# Patient Record
Sex: Male | Born: 1970 | Race: Black or African American | Hispanic: No | Marital: Single | Smoking: Current every day smoker
Health system: Southern US, Community
[De-identification: ages and names within clinical notes are randomized; demographics above are authoritative.]

## PROBLEM LIST (undated history)

## (undated) DIAGNOSIS — J45909 Unspecified asthma, uncomplicated: Secondary | ICD-10-CM

---

## 1998-01-18 ENCOUNTER — Encounter: Payer: Self-pay | Admitting: *Deleted

## 1998-01-18 ENCOUNTER — Emergency Department (HOSPITAL_COMMUNITY): Admission: EM | Admit: 1998-01-18 | Discharge: 1998-01-18 | Payer: Self-pay | Admitting: Emergency Medicine

## 2001-09-08 ENCOUNTER — Emergency Department (HOSPITAL_COMMUNITY): Admission: EM | Admit: 2001-09-08 | Discharge: 2001-09-08 | Payer: Self-pay

## 2004-08-22 ENCOUNTER — Emergency Department (HOSPITAL_COMMUNITY): Admission: EM | Admit: 2004-08-22 | Discharge: 2004-08-22 | Payer: Self-pay | Admitting: Emergency Medicine

## 2004-11-18 ENCOUNTER — Emergency Department (HOSPITAL_COMMUNITY): Admission: EM | Admit: 2004-11-18 | Discharge: 2004-11-18 | Payer: Self-pay | Admitting: Emergency Medicine

## 2005-01-03 ENCOUNTER — Emergency Department (HOSPITAL_COMMUNITY): Admission: EM | Admit: 2005-01-03 | Discharge: 2005-01-03 | Payer: Self-pay | Admitting: Emergency Medicine

## 2005-01-28 ENCOUNTER — Emergency Department (HOSPITAL_COMMUNITY): Admission: EM | Admit: 2005-01-28 | Discharge: 2005-01-28 | Payer: Self-pay | Admitting: Emergency Medicine

## 2005-02-17 ENCOUNTER — Emergency Department (HOSPITAL_COMMUNITY): Admission: EM | Admit: 2005-02-17 | Discharge: 2005-02-17 | Payer: Self-pay | Admitting: Emergency Medicine

## 2005-03-16 ENCOUNTER — Emergency Department (HOSPITAL_COMMUNITY): Admission: EM | Admit: 2005-03-16 | Discharge: 2005-03-16 | Payer: Self-pay | Admitting: Emergency Medicine

## 2006-01-19 ENCOUNTER — Emergency Department (HOSPITAL_COMMUNITY): Admission: EM | Admit: 2006-01-19 | Discharge: 2006-01-19 | Payer: Self-pay | Admitting: Emergency Medicine

## 2006-03-08 ENCOUNTER — Emergency Department (HOSPITAL_COMMUNITY): Admission: EM | Admit: 2006-03-08 | Discharge: 2006-03-08 | Payer: Self-pay | Admitting: Emergency Medicine

## 2006-05-19 ENCOUNTER — Emergency Department (HOSPITAL_COMMUNITY): Admission: EM | Admit: 2006-05-19 | Discharge: 2006-05-19 | Payer: Self-pay | Admitting: Emergency Medicine

## 2006-12-23 ENCOUNTER — Emergency Department (HOSPITAL_COMMUNITY): Admission: EM | Admit: 2006-12-23 | Discharge: 2006-12-23 | Payer: Self-pay | Admitting: Emergency Medicine

## 2007-01-28 ENCOUNTER — Emergency Department (HOSPITAL_COMMUNITY): Admission: EM | Admit: 2007-01-28 | Discharge: 2007-01-28 | Payer: Self-pay | Admitting: Emergency Medicine

## 2007-02-03 ENCOUNTER — Emergency Department (HOSPITAL_COMMUNITY): Admission: EM | Admit: 2007-02-03 | Discharge: 2007-02-03 | Payer: Self-pay | Admitting: Emergency Medicine

## 2007-02-27 ENCOUNTER — Emergency Department (HOSPITAL_COMMUNITY): Admission: EM | Admit: 2007-02-27 | Discharge: 2007-02-27 | Payer: Self-pay | Admitting: Emergency Medicine

## 2007-03-06 ENCOUNTER — Emergency Department (HOSPITAL_COMMUNITY): Admission: EM | Admit: 2007-03-06 | Discharge: 2007-03-06 | Payer: Self-pay | Admitting: *Deleted

## 2007-03-14 ENCOUNTER — Ambulatory Visit: Payer: Self-pay | Admitting: Internal Medicine

## 2007-04-24 ENCOUNTER — Ambulatory Visit: Payer: Self-pay | Admitting: Internal Medicine

## 2007-05-26 ENCOUNTER — Ambulatory Visit: Payer: Self-pay | Admitting: Internal Medicine

## 2007-06-09 ENCOUNTER — Ambulatory Visit: Payer: Self-pay | Admitting: Internal Medicine

## 2007-07-07 ENCOUNTER — Ambulatory Visit: Payer: Self-pay | Admitting: Internal Medicine

## 2007-08-09 ENCOUNTER — Ambulatory Visit: Payer: Self-pay | Admitting: Family Medicine

## 2008-03-21 ENCOUNTER — Ambulatory Visit: Payer: Self-pay | Admitting: Internal Medicine

## 2008-04-29 IMAGING — CR DG CHEST 2V
2 series · 2 of 2 positions shown · non-contrast
Comparison: 08/22/04.

CLINICAL DATA: Wheezing/short of breath.
 CHEST ? 2 VIEW:

[w chest pa]
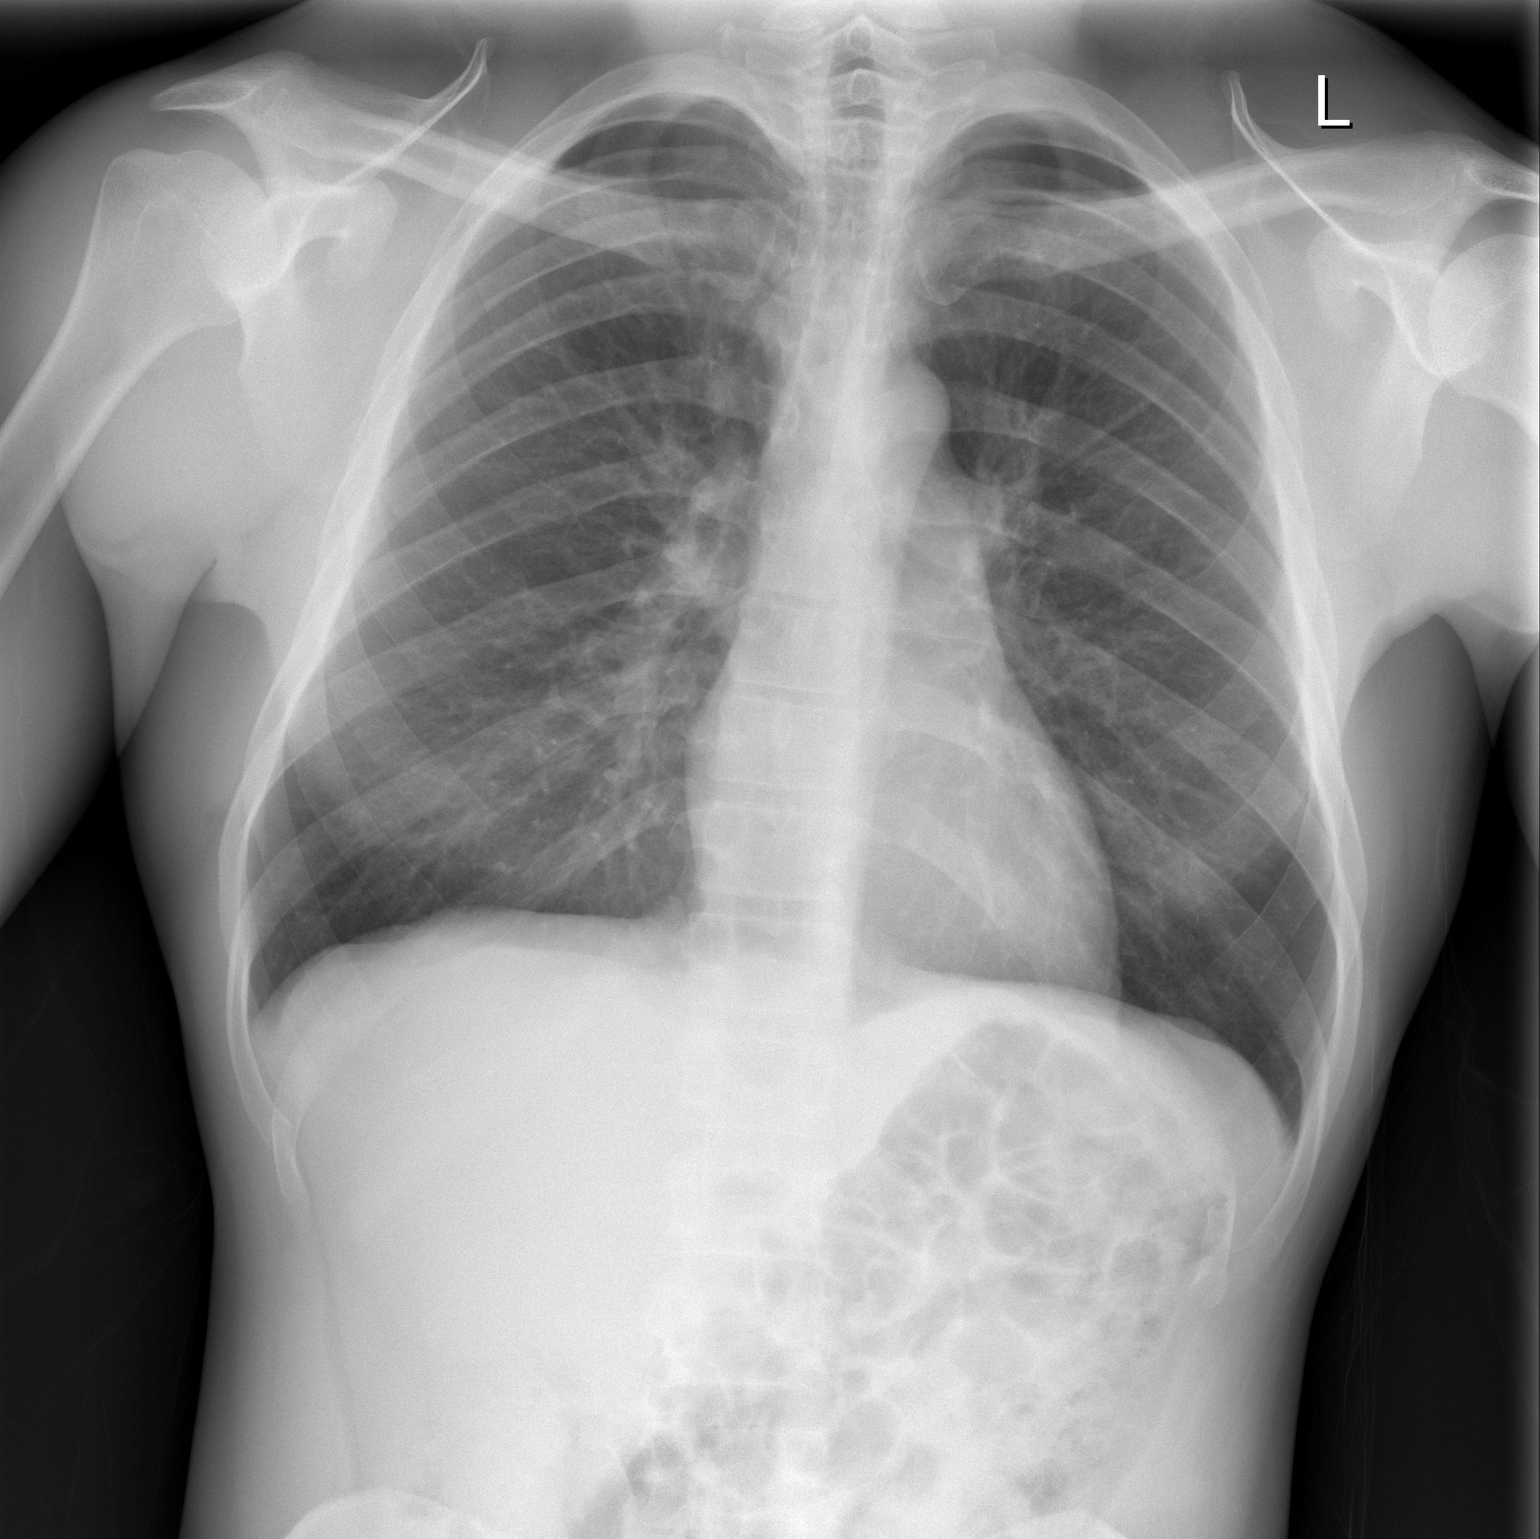

[w chest lat]
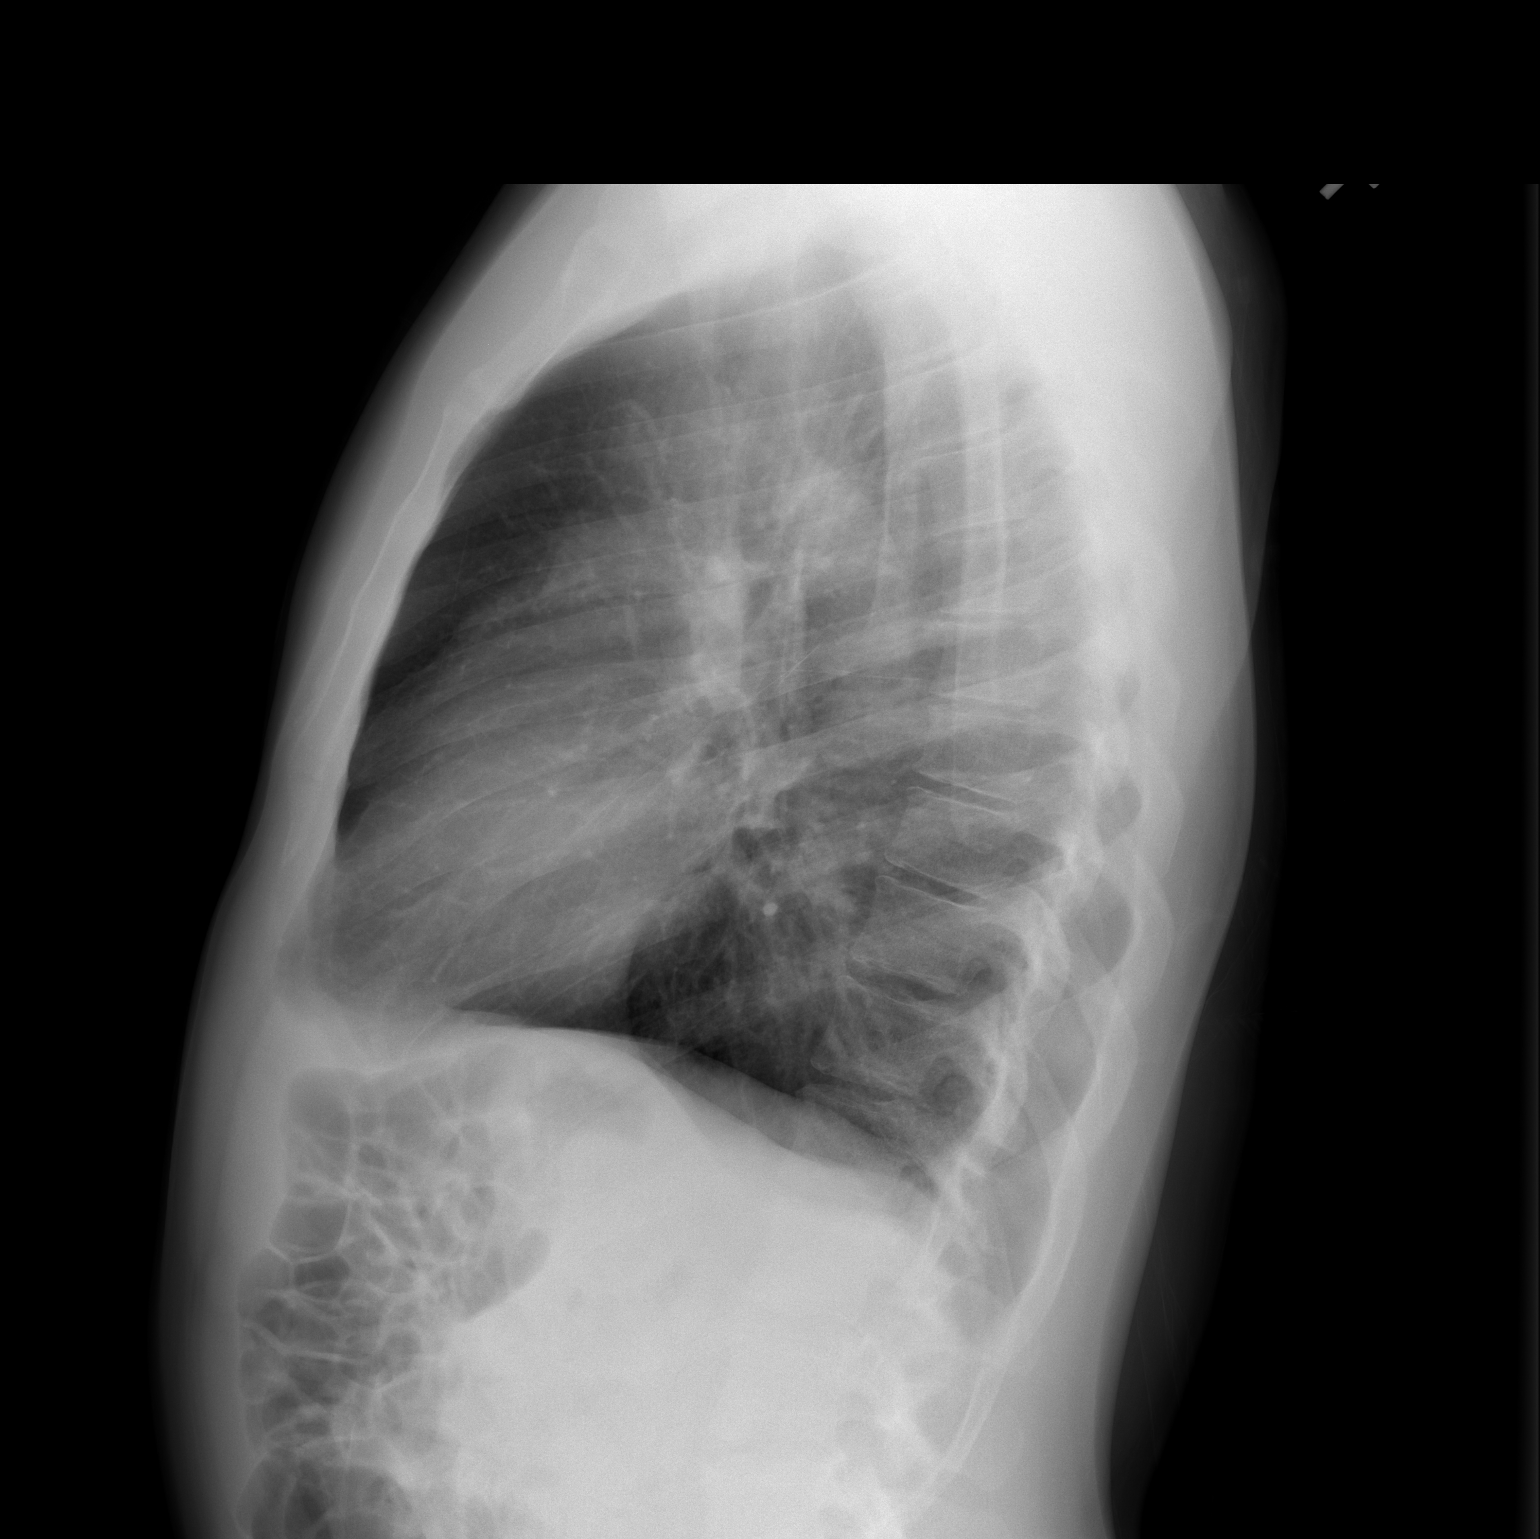

[2 of 2 positions shown; findings below may reference images not displayed]

FINDINGS: Heart and vascularity are normal.  There is moderate hyperaeration consistent with obstructive pulmonary disease.  No active airspace disease or pleural fluid.
IMPRESSION: Hyperaeration/COPD ? no acute process.

## 2009-06-09 ENCOUNTER — Emergency Department (HOSPITAL_COMMUNITY): Admission: EM | Admit: 2009-06-09 | Discharge: 2009-06-09 | Payer: Self-pay | Admitting: Emergency Medicine

## 2009-06-16 ENCOUNTER — Emergency Department (HOSPITAL_COMMUNITY): Admission: EM | Admit: 2009-06-16 | Discharge: 2009-06-16 | Payer: Self-pay | Admitting: Emergency Medicine

## 2009-08-14 ENCOUNTER — Encounter (INDEPENDENT_AMBULATORY_CARE_PROVIDER_SITE_OTHER): Payer: Self-pay | Admitting: Internal Medicine

## 2009-08-14 ENCOUNTER — Ambulatory Visit: Payer: Self-pay | Admitting: Family Medicine

## 2009-08-14 LAB — CONVERTED CEMR LAB
PSA: 1.26 ng/mL (ref 0.10–4.00)
Testosterone: 418.04 ng/dL (ref 350–890)

## 2014-09-15 ENCOUNTER — Emergency Department (HOSPITAL_COMMUNITY)
Admission: EM | Admit: 2014-09-15 | Discharge: 2014-09-15 | Disposition: A | Discharge status: Home / Self Care | Payer: Self-pay | Attending: Emergency Medicine | Admitting: Emergency Medicine

## 2014-09-15 ENCOUNTER — Encounter (HOSPITAL_COMMUNITY): Payer: Self-pay

## 2014-09-15 DIAGNOSIS — R369 Urethral discharge, unspecified: Secondary | ICD-10-CM | POA: Insufficient documentation

## 2014-09-15 DIAGNOSIS — J45909 Unspecified asthma, uncomplicated: Secondary | ICD-10-CM | POA: Insufficient documentation

## 2014-09-15 DIAGNOSIS — Z720 Tobacco use: Secondary | ICD-10-CM | POA: Insufficient documentation

## 2014-09-15 DIAGNOSIS — R300 Dysuria: Secondary | ICD-10-CM | POA: Insufficient documentation

## 2014-09-15 HISTORY — DX: Unspecified asthma, uncomplicated: J45.909

## 2014-09-15 LAB — URINALYSIS, ROUTINE W REFLEX MICROSCOPIC
Bilirubin Urine: NEGATIVE
GLUCOSE, UA: NEGATIVE mg/dL
HGB URINE DIPSTICK: NEGATIVE
Ketones, ur: NEGATIVE mg/dL
Leukocytes, UA: NEGATIVE
Nitrite: NEGATIVE
PH: 7 (ref 5.0–8.0)
Protein, ur: NEGATIVE mg/dL
SPECIFIC GRAVITY, URINE: 1.02 (ref 1.005–1.030)
Urobilinogen, UA: 0.2 mg/dL (ref 0.0–1.0)

## 2014-09-15 MED ORDER — AZITHROMYCIN 250 MG PO TABS
1000.0000 mg | ORAL_TABLET | Freq: Once | ORAL | Status: AC
  Administered 2014-09-15: 1000 mg via ORAL
  Filled 2014-09-15: qty 4

## 2014-09-15 MED ORDER — LIDOCAINE HCL (PF) 1 % IJ SOLN
5.0000 mL | Freq: Once | INTRAMUSCULAR | Status: AC
  Administered 2014-09-15: 5 mL via INTRADERMAL

## 2014-09-15 MED ORDER — ONDANSETRON 4 MG PO TBDP
4.0000 mg | ORAL_TABLET | Freq: Once | ORAL | Status: AC
  Administered 2014-09-15: 4 mg via ORAL
  Filled 2014-09-15: qty 1

## 2014-09-15 MED ORDER — LIDOCAINE HCL (PF) 1 % IJ SOLN
INTRAMUSCULAR | Status: AC
  Administered 2014-09-15: 5 mL via INTRADERMAL
  Filled 2014-09-15: qty 5

## 2014-09-15 MED ORDER — CEFTRIAXONE SODIUM 250 MG IJ SOLR
250.0000 mg | Freq: Once | INTRAMUSCULAR | Status: AC
  Administered 2014-09-15: 250 mg via INTRAMUSCULAR
  Filled 2014-09-15: qty 250

## 2014-09-15 NOTE — ED Notes (Signed)
Pt given water, and informed of need of urine.

## 2014-09-15 NOTE — ED Notes (Signed)
Pt reports 2 days urinary soreness, burning when urinating.  Crust noted at tip of penis at times.  Wants checked for STD.

## 2014-09-15 NOTE — ED Provider Notes (Signed)
CSN: [id number]     Arrival date & time 09/15/14  1155 History  This chart was scribed for non-physician practitioner, Margarita Mail, working with Malvin Johns, MD by Molli Posey, ED Scribe. This patient was seen in room TR07C/TR07C and the patient's care was started at 12:33 PM.   Chief Complaint  Patient presents with  . SEXUALLY TRANSMITTED DISEASE   The history is provided by the patient. No language interpreter was used.   HPI Comments: Jared Houston is a 44 y.o. male with a history of asthma who presents to the Emergency Department complaining of pain with urination for the last 2 days. Pt reports associated burning and penile discharge. Pt states that his penis is slightly sore as well. Pt reports no modifying or exacerbating factors at this time. Pt reports no pertinent past medical history. He denies testicular pain.   Past Medical History  Diagnosis Date  . Asthma    No past surgical history on file. No family history on file. History  Substance Use Topics  . Smoking status: Current Every Day Smoker -- 0.50 packs/day    Types: Cigarettes  . Smokeless tobacco: Not on file  . Alcohol Use: Yes     Comment: 12 beers a week     Review of Systems  Constitutional: Negative for fever.  Genitourinary: Positive for dysuria, discharge and penile pain. Negative for testicular pain.   Allergies  Review of patient's allergies indicates no known allergies.  Home Medications   Prior to Admission medications   Not on File   BP 137/80 mmHg  Pulse 97  Temp(Src) 98.3 F (36.8 C) (Oral)  Resp 16  Ht 5\' 7"  (1.702 m)  Wt 193 lb 9.6 oz (87.816 kg)  BMI 30.31 kg/m2  SpO2 97%   Physical Exam  Constitutional: He is oriented to person, place, and time. He appears well-developed and well-nourished.  HENT:  Head: Normocephalic and atraumatic.  Eyes: Right eye exhibits no discharge. Left eye exhibits no discharge.  Neck: Neck supple. No tracheal deviation present.   Cardiovascular: Normal rate.   Pulmonary/Chest: Effort normal. No respiratory distress.  Abdominal: He exhibits no distension.  Genitourinary:  Circumcised penis. Redness at the entrance of meatus. No inguinal lymphadenopathy. No visible discharge. No testicular pain.   Neurological: He is alert and oriented to person, place, and time.  Skin: Skin is warm and dry.  Psychiatric: He has a normal mood and affect. His behavior is normal.  Nursing note and vitals reviewed.   ED Course  Procedures   DIAGNOSTIC STUDIES: Oxygen Saturation is 97% on RA, normal by my interpretation.    COORDINATION OF CARE: 12:38 PM Discussed treatment plan with pt at bedside and pt agreed to plan.   Labs Review Labs Reviewed  HIV ANTIBODY (ROUTINE TESTING)  RPR  URINALYSIS, ROUTINE W REFLEX MICROSCOPIC  GC/CHLAMYDIA PROBE AMP ([place])    Imaging Review No results found.   EKG Interpretation None      MDM   Final diagnoses:  Dysuria     Pt has been treated prophylacticly with azithromycin and rocephin . hemodynamically stable and  No concern for protstatitis/epididymitis.  I personally performed the services described in this documentation, which was scribed in my presence. The recorded information has been reviewed and is accurate.        Margarita Mail, PA-C 09/16/14 2105  Malvin Johns, MD 09/16/14 [phone]

## 2014-09-15 NOTE — Discharge Instructions (Signed)

## 2014-09-16 LAB — HIV ANTIBODY (ROUTINE TESTING W REFLEX): HIV Screen 4th Generation wRfx: NONREACTIVE

## 2014-09-16 LAB — RPR: RPR Ser Ql: NONREACTIVE

## 2014-10-29 ENCOUNTER — Other Ambulatory Visit (HOSPITAL_COMMUNITY)
Admission: RE | Admit: 2014-10-29 | Discharge: 2014-10-29 | Disposition: A | Discharge status: Home / Self Care | Payer: No Typology Code available for payment source | Source: Ambulatory Visit | Attending: Family Medicine | Admitting: Family Medicine

## 2014-10-29 ENCOUNTER — Encounter (HOSPITAL_COMMUNITY): Payer: Self-pay | Admitting: Emergency Medicine

## 2014-10-29 ENCOUNTER — Emergency Department (INDEPENDENT_AMBULATORY_CARE_PROVIDER_SITE_OTHER)
Admission: EM | Admit: 2014-10-29 | Discharge: 2014-10-29 | Disposition: A | Discharge status: Home / Self Care | Payer: No Typology Code available for payment source | Source: Home / Self Care | Attending: Family Medicine | Admitting: Family Medicine

## 2014-10-29 DIAGNOSIS — J45909 Unspecified asthma, uncomplicated: Secondary | ICD-10-CM

## 2014-10-29 DIAGNOSIS — Z202 Contact with and (suspected) exposure to infections with a predominantly sexual mode of transmission: Secondary | ICD-10-CM

## 2014-10-29 DIAGNOSIS — Z113 Encounter for screening for infections with a predominantly sexual mode of transmission: Secondary | ICD-10-CM | POA: Insufficient documentation

## 2014-10-29 DIAGNOSIS — R300 Dysuria: Secondary | ICD-10-CM

## 2014-10-29 DIAGNOSIS — R369 Urethral discharge, unspecified: Secondary | ICD-10-CM

## 2014-10-29 MED ORDER — LIDOCAINE HCL (PF) 1 % IJ SOLN
INTRAMUSCULAR | Status: AC
  Filled 2014-10-29: qty 5

## 2014-10-29 MED ORDER — DIPHENHYDRAMINE HCL 50 MG/ML IJ SOLN
INTRAMUSCULAR | Status: AC
  Filled 2014-10-29: qty 1

## 2014-10-29 MED ORDER — METHYLPREDNISOLONE ACETATE 80 MG/ML IJ SUSP
INTRAMUSCULAR | Status: AC
  Filled 2014-10-29: qty 1

## 2014-10-29 MED ORDER — CEFTRIAXONE SODIUM 250 MG IJ SOLR
INTRAMUSCULAR | Status: AC
  Filled 2014-10-29: qty 250

## 2014-10-29 MED ORDER — ALBUTEROL SULFATE HFA 108 (90 BASE) MCG/ACT IN AERS: 2.0000 | INHALATION_SPRAY | RESPIRATORY_TRACT | Status: AC | PRN

## 2014-10-29 MED ORDER — AZITHROMYCIN 250 MG PO TABS
1000.0000 mg | ORAL_TABLET | Freq: Once | ORAL | Status: AC
  Administered 2014-10-29: 1000 mg via ORAL

## 2014-10-29 MED ORDER — BECLOMETHASONE DIPROPIONATE 80 MCG/ACT IN AERS
1.0000 | INHALATION_SPRAY | Freq: Two times a day (BID) | RESPIRATORY_TRACT | Status: AC
Start: 2014-10-29 — End: ?

## 2014-10-29 MED ORDER — AZITHROMYCIN 250 MG PO TABS
ORAL_TABLET | ORAL | Status: AC
  Filled 2014-10-29: qty 4

## 2014-10-29 MED ORDER — METHYLPREDNISOLONE ACETATE 80 MG/ML IJ SUSP
80.0000 mg | Freq: Once | INTRAMUSCULAR | Status: AC
  Administered 2014-10-29: 80 mg via INTRAMUSCULAR

## 2014-10-29 MED ORDER — DIPHENHYDRAMINE HCL 50 MG/ML IJ SOLN
50.0000 mg | Freq: Once | INTRAMUSCULAR | Status: AC
  Administered 2014-10-29: 50 mg via INTRAMUSCULAR

## 2014-10-29 MED ORDER — CEFTRIAXONE SODIUM 250 MG IJ SOLR
250.0000 mg | Freq: Once | INTRAMUSCULAR | Status: AC
  Administered 2014-10-29: 250 mg via INTRAMUSCULAR

## 2014-10-29 NOTE — ED Notes (Signed)
Pt states he feels the same and now his stomach hurts a little.  Pt is in no acute distress.  Janne Napoleon, NP aware.  Pt ready for d/c.

## 2014-10-29 NOTE — ED Notes (Signed)
Pt complains of itching all over and his lips swelling slightly.  Janne Napoleon, NP notified and is at the bedside.

## 2014-10-29 NOTE — Discharge Instructions (Signed)
Dysuria Dysuria is the medical term for pain with urination. There are many causes for dysuria, but urinary tract infection is the most common. If a urinalysis was performed it can show that there is a urinary tract infection. A urine culture confirms that you or your child is sick. You will need to follow up with a healthcare provider because:  If a urine culture was done you will need to know the culture results and treatment recommendations.  If the urine culture was positive, you or your child will need to be put on antibiotics or know if the antibiotics prescribed are the right antibiotics for your urinary tract infection.  If the urine culture is negative (no urinary tract infection), then other causes may need to be explored or antibiotics need to be stopped. Today laboratory work may have been done and there does not seem to be an infection. If cultures were done they will take at least 24 to 48 hours to be completed. Today x-rays may have been taken and they read as normal. No cause can be found for the problems. The x-rays may be re-read by a radiologist and you will be contacted if additional findings are made. You or your child may have been put on medications to help with this problem until you can see your primary caregiver. If the problems get better, see your primary caregiver if the problems return. If you were given antibiotics (medications which kill germs), take all of the mediations as directed for the full course of treatment.  If laboratory work was done, you need to find the results. Leave a telephone number where you can be reached. If this is not possible, make sure you find out how you are to get test results. HOME CARE INSTRUCTIONS   Drink lots of fluids. For adults, drink eight, 8 ounce glasses of clear juice or water a day. For children, replace fluids as suggested by your caregiver.  Empty the bladder often. Avoid holding urine for long periods of time.  After a bowel  movement, women should cleanse front to back, using each tissue only once.  Empty your bladder before and after sexual intercourse.  Take all the medicine given to you until it is gone. You may feel better in a few days, but TAKE ALL MEDICINE.  Avoid caffeine, tea, alcohol and carbonated beverages, because they tend to irritate the bladder.  In men, alcohol may irritate the prostate.  Only take over-the-counter or prescription medicines for pain, discomfort, or fever as directed by your caregiver.  If your caregiver has given you a follow-up appointment, it is very important to keep that appointment. Not keeping the appointment could result in a chronic or permanent injury, pain, and disability. If there is any problem keeping the appointment, you must call back to this facility for assistance. SEEK IMMEDIATE MEDICAL CARE IF:   Back pain develops.  A fever develops.  There is nausea (feeling sick to your stomach) or vomiting (throwing up).  Problems are no better with medications or are getting worse. MAKE SURE YOU:   Understand these instructions.  Will watch your condition.  Will get help right away if you are not doing well or get worse. Document Released: 01/30/2004 Document Revised: 07/26/2011 Document Reviewed: 12/07/2007 [facility] Patient Information 2015 [place], [place]. This information is not intended to replace advice given to you by your health care provider. Make sure you discuss any questions you have with your health care provider.  Safe Sex Safe  sex is about reducing the risk of giving or getting a sexually transmitted disease (STD). STDs are spread through sexual contact involving the genitals, mouth, or rectum. Some STDs can be cured and others cannot. Safe sex can also prevent unintended pregnancies.  WHAT ARE SOME SAFE SEX PRACTICES?  Limit your sexual activity to only one partner who is having sex with only you.  Talk to your partner about his or her past  partners, past STDs, and drug use.  Use a condom every time you have sexual intercourse. This includes vaginal, oral, and anal sexual activity. Both females and males should wear condoms during oral sex. Only use latex or polyurethane condoms and water-based lubricants. Using petroleum-based lubricants or oils to lubricate a condom will weaken the condom and increase the chance that it will break. The condom should be in place from the beginning to the end of sexual activity. Wearing a condom reduces, but does not completely eliminate, your risk of getting or giving an STD. STDs can be spread by contact with infected body fluids and skin.  Get vaccinated for hepatitis B and HPV.  Avoid alcohol and recreational drugs, which can affect your judgment. You may forget to use a condom or participate in high-risk sex.  For females, avoid douching after sexual intercourse. Douching can spread an infection farther into the reproductive tract.  Check your body for signs of sores, blisters, rashes, or unusual discharge. See your health care provider if you notice any of these signs.  Avoid sexual contact if you have symptoms of an infection or are being treated for an STD. If you or your partner has herpes, avoid sexual contact when blisters are present. Use condoms at all other times.  If you are at risk of being infected with HIV, it is recommended that you take a prescription medicine daily to prevent HIV infection. This is called pre-exposure prophylaxis (PrEP). You are considered at risk if:  You are a man who has sex with other men (MSM).  You are a heterosexual man or woman who is sexually active with more than one partner.  You take drugs by injection.  You are sexually active with a partner who has HIV.  Talk with your health care provider about whether you are at high risk of being infected with HIV. If you choose to begin PrEP, you should first be tested for HIV. You should then be tested  every 3 months for as long as you are taking PrEP.  See your health care provider for regular screenings, exams, and tests for other STDs. Before having sex with a new partner, each of you should be screened for STDs and should talk about the results with each other. WHAT ARE THE BENEFITS OF SAFE SEX?   There is less chance of getting or giving an STD.  You can prevent unwanted or unintended pregnancies.  By discussing safe sex concerns with your partner, you may increase feelings of intimacy, comfort, trust, and honesty between the two of you. Document Released: 06/10/2004 Document Revised: 09/17/2013 Document Reviewed: 10/25/2011 [facility] Patient Information 2015 [place], [place]. This information is not intended to replace advice given to you by your health care provider. Make sure you discuss any questions you have with your health care provider.  Sexually Transmitted Disease A sexually transmitted disease (STD) is a disease or infection that may be passed (transmitted) from person to person, usually during sexual activity. This may happen by way of saliva, semen, blood, vaginal mucus,  or urine. Common STDs include:   Gonorrhea.   Chlamydia.   Syphilis.   HIV and AIDS.   Genital herpes.   Hepatitis B and C.   Trichomonas.   Human papillomavirus (HPV).   Pubic lice.   Scabies.  Mites.  Bacterial vaginosis. WHAT ARE CAUSES OF STDs? An STD may be caused by bacteria, a virus, or parasites. STDs are often transmitted during sexual activity if one person is infected. However, they may also be transmitted through nonsexual means. STDs may be transmitted after:   Sexual intercourse with an infected person.   Sharing sex toys with an infected person.   Sharing needles with an infected person or using unclean piercing or tattoo needles.  Having intimate contact with the genitals, mouth, or rectal areas of an infected person.   Exposure to infected fluids during  birth. WHAT ARE THE SIGNS AND SYMPTOMS OF STDs? Different STDs have different symptoms. Some people may not have any symptoms. If symptoms are present, they may include:   Painful or bloody urination.   Pain in the pelvis, abdomen, vagina, anus, throat, or eyes.   A skin rash, itching, or irritation.  Growths, ulcerations, blisters, or sores in the genital and anal areas.  Abnormal vaginal discharge with or without bad odor.   Penile discharge in men.   Fever.   Pain or bleeding during sexual intercourse.   Swollen glands in the groin area.   Yellow skin and eyes (jaundice). This is seen with hepatitis.   Swollen testicles.  Infertility.  Sores and blisters in the mouth. HOW ARE STDs DIAGNOSED? To make a diagnosis, your health care provider may:   Take a medical history.   Perform a physical exam.   Take a sample of any discharge to examine.  Swab the throat, cervix, opening to the penis, rectum, or vagina for testing.  Test a sample of your first morning urine.   Perform blood tests.   Perform a Pap test, if this applies.   Perform a colposcopy.   Perform a laparoscopy.  HOW ARE STDs TREATED? Treatment depends on the STD. Some STDs may be treated but not cured.   Chlamydia, gonorrhea, trichomonas, and syphilis can be cured with antibiotic medicine.   Genital herpes, hepatitis, and HIV can be treated, but not cured, with prescribed medicines. The medicines lessen symptoms.   Genital warts from HPV can be treated with medicine or by freezing, burning (electrocautery), or surgery. Warts may come back.   HPV cannot be cured with medicine or surgery. However, abnormal areas may be removed from the cervix, vagina, or vulva.   If your diagnosis is confirmed, your recent sexual partners need treatment. This is true even if they are symptom-free or have a negative culture or evaluation. They should not have sex until their health care providers  say it is okay. HOW CAN I REDUCE MY RISK OF GETTING AN STD? Take these steps to reduce your risk of getting an STD:  Use latex condoms, dental dams, and water-soluble lubricants during sexual activity. Do not use petroleum jelly or oils.  Avoid having multiple sex partners.  Do not have sex with someone who has other sex partners.  Do not have sex with anyone you do not know or who is at high risk for an STD.  Avoid risky sex practices that can break your skin.  Do not have sex if you have open sores on your mouth or skin.  Avoid drinking too much alcohol  or taking illegal drugs. Alcohol and drugs can affect your judgment and put you in a vulnerable position.  Avoid engaging in oral and anal sex acts.  Get vaccinated for HPV and hepatitis. If you have not received these vaccines in the past, talk to your health care provider about whether one or both might be right for you.   If you are at risk of being infected with HIV, it is recommended that you take a prescription medicine daily to prevent HIV infection. This is called pre-exposure prophylaxis (PrEP). You are considered at risk if:  You are a man who has sex with other men (MSM).  You are a heterosexual man or woman and are sexually active with more than one partner.  You take drugs by injection.  You are sexually active with a partner who has HIV.  Talk with your health care provider about whether you are at high risk of being infected with HIV. If you choose to begin PrEP, you should first be tested for HIV. You should then be tested every 3 months for as long as you are taking PrEP.  WHAT SHOULD I DO IF I THINK I HAVE AN STD?  See your health care provider.   Tell your sexual partner(s). They should be tested and treated for any STDs.  Do not have sex until your health care provider says it is okay. WHEN SHOULD I GET IMMEDIATE MEDICAL CARE? Contact your health care provider right away if:   You have severe  abdominal pain.  You are a man and notice swelling or pain in your testicles.  You are a woman and notice swelling or pain in your vagina. Document Released: 07/24/2002 Document Revised: 05/08/2013 Document Reviewed: 11/21/2012 [facility] Patient Information 2015 [place], [place]. This information is not intended to replace advice given to you by your health care provider. Make sure you discuss any questions you have with your health care provider.

## 2014-10-29 NOTE — ED Notes (Signed)
Pt states he was treated for an STD a month ago with a shot and some pills.  He was never called with any positive results.  He is having some of the same symptoms again.  He is also suffering from asthma because he has run out [facility] and [facility].  He needs a medication refill on Proventil and QVar.

## 2014-10-29 NOTE — ED Provider Notes (Signed)
CSN: [id number]     Arrival date & time 10/29/14  1429 History   First MD Initiated Contact with Patient 10/29/14 1622     Chief Complaint  Patient presents with  . Exposure to STD  . Asthma   (Consider location/radiation/quality/duration/timing/severity/associated sxs/prior Treatment) HPI Comments: 44 year old male complaining of penile discharge and swelling of the urethra. States symptoms began 2 days ago. The symptoms are identical to the symptoms for which she was treated a month ago with an injection and pills. He is also requesting a refill on his albuterol medication since they did not give him enough medicine when [facility] the [facility] 3 months ago. [facility] an appointment with [facility] in a little over a month.   Past Medical History  Diagnosis Date  . Asthma    History reviewed. No pertinent past surgical history. History reviewed. No pertinent family history. History  Substance Use Topics  . Smoking status: Current Every Day Smoker -- 0.50 packs/day    Types: Cigarettes  . Smokeless tobacco: Not on file  . Alcohol Use: Yes     Comment: 12 beers a week     Review of Systems  Constitutional: Negative.   Respiratory: Positive for shortness of breath and wheezing.   Genitourinary: Positive for dysuria and discharge. Negative for urgency, hematuria, penile swelling, scrotal swelling, genital sores and testicular pain.  All other systems reviewed and are negative.   Allergies  Review of patient's allergies indicates no known allergies.  Home Medications   Prior to Admission medications   Medication Sig Start Date End Date Taking? Authorizing Provider  albuterol (PROVENTIL HFA) 108 (90 BASE) MCG/ACT inhaler Inhale 2 puffs into the lungs every 6 (six) hours as needed for wheezing or shortness of breath.   Yes Historical Provider, MD  beclomethasone (QVAR) 80 MCG/ACT inhaler Inhale 1 puff into the lungs 2 (two) times daily.   Yes Historical Provider, MD    BP 134/89 mmHg  Pulse 56  Temp(Src) 98.7 F (37.1 C) (Oral)  Resp 12  SpO2 100% Physical Exam  Constitutional: He is oriented to person, place, and time. He appears well-developed and well-nourished.  Neck: Normal range of motion. Neck supple.  Pulmonary/Chest: Effort normal. No respiratory distress.  Genitourinary: Penis normal. No penile tenderness.  Normal external male genitalia No external lesions observed. Bilateral  testicles descended. No enlargement or tenderness. no palpable nodules. No swelling of the scrotum or testicles. No tenderness to the epididymal apparatus. No surrounding lymphadenopathy.   Musculoskeletal: He exhibits no edema.  Neurological: He is alert and oriented to person, place, and time.  Skin: Skin is warm and dry.  Nursing note and vitals reviewed.   ED Course  Procedures (including critical care time) Labs Review Labs Reviewed  URINE CYTOLOGY ANCILLARY ONLY    Imaging Review No results found.   MDM   1. Abnormal penile discharge   2. Dysuria   3. Exposure to STD    Urine cytology pending Rocephin 250 mg IM Azithromycin 1 gm po     Janne Napoleon, NP 10/29/14 1641  Within 10 minutes of receiving the azithromycin and ceftriaxone injection the patient started itching all over and feeling like his upper lip was swelling. He states that this happened last month he received these medications but did not say anything about it today. He is given Benadryl 50 mg IM and Depo-Medrol 80 mg IM. Reexamination reveals clear lungs, no wheezing. No intraoral swelling. No observed swelling of  the face or lips. And I do not see evidence of urticaria at this time. Pt discharged asymptomatic approx 1725.  Janne Napoleon, NP 10/29/14 1739

## 2014-10-30 LAB — URINE CYTOLOGY ANCILLARY ONLY
CHLAMYDIA, DNA PROBE: POSITIVE — AB
Neisseria Gonorrhea: NEGATIVE
TRICH (WINDOWPATH): NEGATIVE

## 2014-10-31 NOTE — ED Notes (Signed)
Final report of STD screening positive for chlamydia, treatment adequate on day of clinic visit. Form 2124 [employer]HS completed and faxed to [facility] for their records. Called and left message for patient to contact us ASAP

## 2014-11-02 NOTE — ED Notes (Signed)
Patient returned call. After verifying ID, discussed positive findings. Was advised to have any partners to be informed , and will have them go for treatment. Advised to practice safer sex

## 2018-12-25 ENCOUNTER — Encounter (HOSPITAL_COMMUNITY): Payer: Self-pay

## 2018-12-25 ENCOUNTER — Other Ambulatory Visit: Payer: Self-pay

## 2018-12-25 ENCOUNTER — Emergency Department (HOSPITAL_COMMUNITY)
Admission: EM | Admit: 2018-12-25 | Discharge: 2018-12-25 | Disposition: A | Discharge status: Home / Self Care | Payer: Self-pay | Attending: Emergency Medicine | Admitting: Emergency Medicine

## 2018-12-25 DIAGNOSIS — R300 Dysuria: Secondary | ICD-10-CM

## 2018-12-25 DIAGNOSIS — J45909 Unspecified asthma, uncomplicated: Secondary | ICD-10-CM | POA: Insufficient documentation

## 2018-12-25 DIAGNOSIS — F17210 Nicotine dependence, cigarettes, uncomplicated: Secondary | ICD-10-CM | POA: Insufficient documentation

## 2018-12-25 MED ORDER — GENTAMICIN SULFATE 40 MG/ML IJ SOLN
240.0000 mg | Freq: Once | INTRAMUSCULAR | Status: AC
  Administered 2018-12-25: 19:00:00 240 mg via INTRAMUSCULAR
  Filled 2018-12-25: qty 6

## 2018-12-25 MED ORDER — AZITHROMYCIN 1 G PO PACK
2.0000 g | PACK | Freq: Once | ORAL | Status: AC
  Administered 2018-12-25: 18:00:00 2 g via ORAL
  Filled 2018-12-25: qty 2

## 2018-12-25 NOTE — ED Triage Notes (Signed)
Pt arrives POV for eval of penile burning/dc since Friday. Reports partner is also here w/ similar sx

## 2018-12-25 NOTE — ED Provider Notes (Signed)
[facility] EMERGENCY DEPARTMENT Provider Note   CSN: [id number] Arrival date & time: 12/25/18  1516    History   Chief Complaint Chief Complaint  Patient presents with  . Dysuria    HPI Jared Houston is a 48 y.o. male.     48yo male presents with complaint of urethral discharge x 2-3 days, described as green, associated with dysuria. Denies testicular pain or swelling, any rashes or lesions. Patient is here with his significant other to be tested for STIs. No other complaints or concerns.      Past Medical History:  Diagnosis Date  . Asthma     There are no active problems to display for this patient.   History reviewed. No pertinent surgical history.      Home Medications    Prior to Admission medications   Medication Sig Start Date End Date Taking? Authorizing Provider  albuterol (PROVENTIL HFA;VENTOLIN HFA) 108 (90 BASE) MCG/ACT inhaler Inhale 2 puffs into the lungs every 4 (four) hours as needed for wheezing or shortness of breath. 10/29/14  Yes Mabe, Shanon Brow, NP  Naphazoline HCl (CLEAR EYES OP) Place 1 application into both eyes daily.   Yes [provider]  naproxen sodium (ALEVE) 220 MG tablet Take 440 mg by mouth 2 (two) times daily as needed (pain).   Yes [provider]  beclomethasone (QVAR) 80 MCG/ACT inhaler Inhale 1 puff into the lungs 2 (two) times daily. Patient not taking: Reported on 12/25/2018 10/29/14   Janne Napoleon, NP    Family History History reviewed. No pertinent family history.  Social History Social History   Tobacco Use  . Smoking status: Current Every Day Smoker    Packs/day: 0.50    Types: Cigarettes  Substance Use Topics  . Alcohol use: Yes    Comment: 12 beers a week   . Drug use: No     Allergies   Rocephin [ceftriaxone]   Review of Systems Review of Systems  Constitutional: Negative for fever.  Gastrointestinal: Negative for abdominal pain, constipation, diarrhea, nausea and  vomiting.  Genitourinary: Positive for discharge and dysuria. Negative for penile pain, penile swelling, scrotal swelling and testicular pain.  Skin: Negative for rash and wound.  Allergic/Immunologic: Negative for immunocompromised state.  Neurological: Negative for weakness.  Hematological: Negative for adenopathy.  All other systems reviewed and are negative.    Physical Exam Updated Vital Signs BP 130/83 (BP Location: Right Arm)   Pulse 80   Temp 98.4 F (36.9 C) (Oral)   Resp 20   Ht 5\' 7"  (1.702 m)   Wt 79.4 kg   SpO2 98%   BMI 27.41 kg/m   Physical Exam Vitals signs and nursing note reviewed. Exam conducted with a chaperone present.  Constitutional:      General: He is not in acute distress.    Appearance: Normal appearance. He is well-developed. He is not diaphoretic.  HENT:     Head: Normocephalic and atraumatic.  Pulmonary:     Effort: Pulmonary effort is normal.  Genitourinary:    Penis: Circumcised. Discharge present.      Scrotum/Testes: Normal.     Comments: White discharge present Skin:    General: Skin is warm and dry.     Findings: No erythema or rash.  Neurological:     Mental Status: He is alert and oriented to person, place, and time.  Psychiatric:        Behavior: Behavior normal.      ED  Treatments / Results  Labs (all labs ordered are listed, but only abnormal results are displayed) Labs Reviewed  GC/CHLAMYDIA PROBE AMP ([place]) NOT AT [facility]    EKG None  Radiology No results found.  Procedures Procedures (including critical care time)  Medications Ordered in ED Medications  gentamicin (GARAMYCIN) injection 240 mg (has no administration in time range)  azithromycin (ZITHROMAX) powder 2 g (2 g Oral Given 12/25/18 1826)     Initial Impression / Assessment and Plan / ED Course  I have reviewed the triage vital signs and the nursing notes.  Pertinent labs & imaging results that were available during my care of the patient  were reviewed by me and considered in my medical decision making (see chart for details).  Clinical Course as of Dec 24 1825  Mon Dec 25, 6658  953 48 year old male presents for STD test, states he has had burning with urination as well as green penile discharge.  Patient states his partner is here with similar symptoms.  Patient denies testicular pain or swelling.  On exam has white discharge present.  Gonorrhea chlamydia swab obtained.  Patient has allergy to Rocephin, given previously for STD resulting in severe hives. Patient was given Zithromax and Gentamicin IM per UTD. Advised to abstain from intercourse x 10 days.    [LM]    Clinical Course User Index [LM] Tacy Learn, PA-C      Final Clinical Impressions(s) / ED Diagnoses   Final diagnoses:  Dysuria    ED Discharge Orders    None       Roque Lias 12/25/18 1827    Dorie Rank, MD 12/27/18 [phone]

## 2018-12-25 NOTE — Discharge Instructions (Addendum)
Abstain from intercourse until you know your test results. Call the hospital in 3-5 days for your results. IF your gonorrhea and chlamydia tests are negative, you may resume intercourse. IF either test is positive, your partner will need to go to the health department for free treatment. IF your test is positive, both partners need to abstain from intercourse for 10 days after treatment (this includes all forms of intercourse).

## 2018-12-25 NOTE — ED Notes (Signed)
Patient verbalizes understanding of discharge instructions. Opportunity for questioning and answers were provided. Armband removed by staff, pt discharged from ED.  

## 2018-12-27 LAB — GC/CHLAMYDIA PROBE AMP (~~LOC~~) NOT AT ARMC
Chlamydia: POSITIVE — AB
Neisseria Gonorrhea: POSITIVE — AB

## 2020-01-28 DIAGNOSIS — D123 Benign neoplasm of transverse colon: Secondary | ICD-10-CM

## 2020-01-28 DIAGNOSIS — D120 Benign neoplasm of cecum: Secondary | ICD-10-CM

## 2021-01-29 ENCOUNTER — Ambulatory Visit
Admission: EM | Admit: 2021-01-29 | Discharge: 2021-01-29 | Disposition: A | Discharge status: Home / Self Care | Payer: Self-pay | Attending: Internal Medicine | Admitting: Internal Medicine

## 2021-01-29 ENCOUNTER — Other Ambulatory Visit: Payer: Self-pay

## 2021-01-29 DIAGNOSIS — K047 Periapical abscess without sinus: Secondary | ICD-10-CM

## 2021-01-29 MED ORDER — AMOXICILLIN 875 MG PO TABS: 875.0000 mg | ORAL_TABLET | Freq: Two times a day (BID) | ORAL | 0 refills | Status: AC

## 2021-01-29 NOTE — ED Triage Notes (Signed)
Pt c/o oral abscess and a "bad tooth that needs to be pulled." Abscess is on right side with observable edema to the right cheek onset two days ago. Described as 6/10 achy throbbing pain. States tried 2 aleve today without appreciable relief.

## 2021-01-29 NOTE — ED Provider Notes (Signed)
[facility]    CSN: [id number] Arrival date & time: 01/29/21  1111      History   Chief Complaint Chief Complaint  Patient presents with   Dental Pain    HPI Jared Houston is a 50 y.o. male.   Patient presents with right lower dental pain that has been present for a few days.  Patient is trying to get in with dentist currently for further evaluation and management.  Denies any drainage from the gums or teeth.  Denies any known fevers.  Has been taking Aleve with some relief of pain.    Dental Pain  Past Medical History:  Diagnosis Date   Asthma     There are no problems to display for this patient.   History reviewed. No pertinent surgical history.     Home Medications    Prior to Admission medications   Medication Sig Start Date End Date Taking? Authorizing Provider  amoxicillin (AMOXIL) 875 MG tablet Take 1 tablet (875 mg total) by mouth 2 (two) times daily for 10 days. 01/29/21 02/08/21 Yes Odis Luster, FNP  albuterol (PROVENTIL HFA;VENTOLIN HFA) 108 (90 BASE) MCG/ACT inhaler Inhale 2 puffs into the lungs every 4 (four) hours as needed for wheezing or shortness of breath. 10/29/14   Janne Napoleon, NP  beclomethasone (QVAR) 80 MCG/ACT inhaler Inhale 1 puff into the lungs 2 (two) times daily. Patient not taking: Reported on 12/25/2018 10/29/14   Janne Napoleon, NP  Naphazoline HCl (CLEAR EYES OP) Place 1 application into both eyes daily.    [provider]  naproxen sodium (ALEVE) 220 MG tablet Take 440 mg by mouth 2 (two) times daily as needed (pain).    [provider]    Family History History reviewed. No pertinent family history.  Social History Social History   Tobacco Use   Smoking status: Every Day    Packs/day: 0.50    Types: Cigarettes   Smokeless tobacco: Never  Substance Use Topics   Alcohol use: Yes    Comment: 12 beers a week    Drug use: No     Allergies   Rocephin [ceftriaxone]   Review of  Systems Review of Systems Per HPI  Physical Exam Triage Vital Signs ED Triage Vitals  Enc Vitals Group     BP 01/29/21 1335 (!) 145/75     Pulse Rate 01/29/21 1335 63     Resp 01/29/21 1335 18     Temp 01/29/21 1335 98.3 F (36.8 C)     Temp Source 01/29/21 1335 Oral     SpO2 01/29/21 1335 98 %     Weight --      Height --      Head Circumference --      Peak Flow --      Pain Score 01/29/21 1337 6     Pain Loc --      Pain Edu? --      Excl. in [place]? --    No data found.  Updated Vital Signs BP (!) 145/75 (BP Location: Left Arm)   Pulse 63   Temp 98.3 F (36.8 C) (Oral)   Resp 18   SpO2 98%   Visual Acuity Right Eye Distance:   Left Eye Distance:   Bilateral Distance:    Right Eye Near:   Left Eye Near:    Bilateral Near:     Physical Exam Constitutional:      Appearance: Normal appearance.  HENT:  Head: Normocephalic and atraumatic.     Mouth/Throat:     Mouth: Mucous membranes are moist.     Dentition: Dental tenderness, gingival swelling and dental abscesses present.      Comments: Dental abscess and broken tooth present to right upper gingiva and dentition.  No drainage noted at this time. Eyes:     Extraocular Movements: Extraocular movements intact.     Conjunctiva/sclera: Conjunctivae normal.  Pulmonary:     Effort: Pulmonary effort is normal.  Lymphadenopathy:     Cervical: No cervical adenopathy.  Neurological:     General: No focal deficit present.     Mental Status: He is alert and oriented to person, place, and time. Mental status is at baseline.  Psychiatric:        Mood and Affect: Mood normal.        Behavior: Behavior normal.        Thought Content: Thought content normal.        Judgment: Judgment normal.     UC Treatments / Results  Labs (all labs ordered are listed, but only abnormal results are displayed) Labs Reviewed - No data to display  EKG   Radiology No results found.  Procedures Procedures (including  critical care time)  Medications Ordered in UC Medications - No data to display  Initial Impression / Assessment and Plan / UC Course  I have reviewed the triage vital signs and the nursing notes.  Pertinent labs & imaging results that were available during my care of the patient were reviewed by me and considered in my medical decision making (see chart for details).     Will treat dental abscess with amoxicillin x10 days.  Patient states that he has taken amoxicillin without allergic reaction before.  Patient advised to follow-up with a dentist for further evaluation and management.  Patient to use warm compresses to affected area of face as well.  May continue Aleve for pain. Discussed strict return precautions. Patient verbalized understanding and is agreeable with plan..  Final Clinical Impressions(s) / UC Diagnoses   Final diagnoses:  Dental abscess     Discharge Instructions      Please follow-up with dentist for further evaluation and management.     ED Prescriptions     Medication Sig Dispense Auth. Provider   amoxicillin (AMOXIL) 875 MG tablet Take 1 tablet (875 mg total) by mouth 2 (two) times daily for 10 days. 20 tablet Odis Luster, FNP      PDMP not reviewed this encounter.   Odis Luster, FNP 01/29/21 1424

## 2021-01-29 NOTE — Discharge Instructions (Addendum)
Please follow-up with dentist for further evaluation and management.

## 2021-09-01 ENCOUNTER — Ambulatory Visit
Admission: RE | Admit: 2021-09-01 | Discharge: 2021-09-01 | Disposition: A | Discharge status: Home / Self Care | Payer: Self-pay | Source: Ambulatory Visit

## 2021-09-01 ENCOUNTER — Ambulatory Visit: Payer: Self-pay

## 2021-09-01 VITALS — BP 156/85 | HR 81 | Temp 97.7°F | Resp 16

## 2021-09-01 DIAGNOSIS — T25222A Burn of second degree of left foot, initial encounter: Secondary | ICD-10-CM
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
## 2021-09-01 NOTE — Discharge Instructions (Signed)
Please go to the emergency department as soon as you leave urgent care for further evaluation and management. ?

## 2021-09-01 NOTE — ED Notes (Signed)
Patient is being discharged from the Urgent Care and sent to the Emergency Department via private vehicle . Per [place] patient is in need of higher level of care due to further evaluation. Patient is aware and verbalizes understanding of plan of care.  ?Vitals:  ? 09/01/21 1802  ?BP: (!) 156/85  ?Pulse: 81  ?Resp: 16  ?Temp: 97.7 ?F (36.5 ?C)  ?SpO2: 97%  ?  ?
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
## 2021-09-30 ENCOUNTER — Ambulatory Visit (HOSPITAL_COMMUNITY)
Admission: EM | Admit: 2021-09-30 | Discharge: 2021-09-30 | Disposition: A | Discharge status: Home / Self Care | Payer: Self-pay | Attending: Emergency Medicine | Admitting: Emergency Medicine

## 2021-09-30 ENCOUNTER — Encounter (HOSPITAL_COMMUNITY): Payer: Self-pay

## 2021-09-30 DIAGNOSIS — T3110 Burns involving 10-19% of body surface with 0% to 9% third degree burns: Secondary | ICD-10-CM

## 2021-09-30 MED ORDER — OXYCODONE-ACETAMINOPHEN 5-325 MG PO TABS: 1.0000 | ORAL_TABLET | Freq: Four times a day (QID) | ORAL | 0 refills | Status: DC | PRN

## 2021-09-30 MED ORDER — SILVER SULFADIAZINE 1 % EX CREA: 1.0000 "application " | TOPICAL_CREAM | Freq: Two times a day (BID) | CUTANEOUS | 0 refills | Status: AC

## 2021-09-30 MED ORDER — AMOXICILLIN-POT CLAVULANATE 875-125 MG PO TABS: 1.0000 | ORAL_TABLET | Freq: Two times a day (BID) | ORAL | 0 refills | Status: AC

## 2021-09-30 NOTE — Discharge Instructions (Addendum)
Change your bandage twice a day. Use the antibiotic cream on the wound each time you change it. Please follow up with the wound clinic as soon as possible.  ?
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
## 2021-10-09 ENCOUNTER — Encounter (HOSPITAL_BASED_OUTPATIENT_CLINIC_OR_DEPARTMENT_OTHER): Payer: Self-pay | Attending: General Surgery | Admitting: General Surgery

## 2021-10-09 ENCOUNTER — Ambulatory Visit (HOSPITAL_COMMUNITY)
Admission: EM | Admit: 2021-10-09 | Discharge: 2021-10-09 | Disposition: A | Discharge status: Home / Self Care | Payer: Self-pay | Attending: Emergency Medicine | Admitting: Emergency Medicine

## 2021-10-09 ENCOUNTER — Encounter (HOSPITAL_COMMUNITY): Payer: Self-pay

## 2021-10-09 DIAGNOSIS — T310 Burns involving less than 10% of body surface: Secondary | ICD-10-CM | POA: Insufficient documentation

## 2021-10-09 DIAGNOSIS — X58XXXA Exposure to other specified factors, initial encounter: Secondary | ICD-10-CM | POA: Insufficient documentation

## 2021-10-09 DIAGNOSIS — J45909 Unspecified asthma, uncomplicated: Secondary | ICD-10-CM | POA: Insufficient documentation

## 2021-10-09 DIAGNOSIS — T25222A Burn of second degree of left foot, initial encounter: Secondary | ICD-10-CM | POA: Insufficient documentation

## 2021-10-09 DIAGNOSIS — F17210 Nicotine dependence, cigarettes, uncomplicated: Secondary | ICD-10-CM | POA: Insufficient documentation

## 2021-10-09 DIAGNOSIS — T25222D Burn of second degree of left foot, subsequent encounter: Secondary | ICD-10-CM

## 2021-10-09 MED ORDER — OXYCODONE-ACETAMINOPHEN 5-325 MG PO TABS: 1.0000 | ORAL_TABLET | Freq: Four times a day (QID) | ORAL | 0 refills | Status: AC | PRN

## 2021-10-09 MED ORDER — OXYCODONE-ACETAMINOPHEN 5-325 MG PO TABS: 1.0000 | ORAL_TABLET | Freq: Four times a day (QID) | ORAL | 0 refills | Status: DC | PRN
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
## 2021-10-09 NOTE — Discharge Instructions (Addendum)
Below are the dosages and frequency for over-the-counter pain medications  Tylenol ( acetaminophen) 500-100 mg every 6 hours and/or one the following below   Ibuprofen( motrin) (Advil)  600-800 mg every 6-8 hours   Aleve ( naproxen )  440 mg twice a day   You may use oxycodone acetaminophen every 6 hours as needed for severe pain, to use any of the following above medications prior to your using narcotic pain medicine  Save at least one oxycodone-acetaminophen tablet for your next wound care appointment, take 1 hour before appointment beginning so that you have medication in your system so that ideally you are able to tolerate your wound cleaning  Follow-up with urgent care as needed
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
## 2021-10-09 NOTE — Progress Notes (Signed)
Houston, Jared ([id number]) Visit Report for 10/09/2021 Abuse Risk Screen Details Patient Name: Date of Service: Jared Houston, Jared Houston 10/09/2021 9:00 A M Medical Record Number: [id number] Patient Account Number: [id number] Date of Birth/Sex: Treating RN: 13-Jun-1970 (51 y.o. [name] Primary Care Wilkins Elpers: PA [place], [place] Other Clinician: Referring Mekaela Azizi: Treating Merrill Deanda/Extender: Jadene Pierini in Treatment: 0 Abuse Risk Screen Items Answer ABUSE RISK SCREEN: Has anyone close to you tried to hurt or harm you recentlyo No Do you feel uncomfortable with anyone in your familyo No Has anyone forced you do things that you didnt want to doo No Electronic Signature(s) Signed: 10/09/2021 4:37:43 PM By: Baruch Gouty RN, BSN Entered By: Baruch Gouty on 10/09/2021 09:58:24 -------------------------------------------------------------------------------- Activities of Daily Living Details Patient Name: Date of Service: Jared Houston, Jared L. 10/09/2021 9:00 A M Medical Record Number: [id number] Patient Account Number: [id number] Date of Birth/Sex: Treating RN: Apr 14, 1971 (51 y.o. [name] Primary Care Tamas Suen: PA [place]ig [place], [place] Other Clinician: Referring Sharran Caratachea: Treating Natanel Snavely/Extender: Jadene Pierini in Treatment: 0 Activities of Daily Living Items Answer Activities of Daily Living (Please select one for each item) Drive Automobile Completely Able T Medications ake Completely Able Use T elephone Completely Able Care for Appearance Completely Able Use T oilet Completely Able Bath / Shower Completely Able Dress Self Completely Able Feed Self Completely Able Walk Completely Able Get In / Out Bed Completely Able Housework Completely Able Prepare Meals Completely [place] for Self Completely Able Electronic Signature(s) Signed: 10/09/2021 4:37:43 PM By: Baruch Gouty RN, BSN Entered By: Baruch Gouty on 10/09/2021  09:58:50 -------------------------------------------------------------------------------- Education Screening Details Patient Name: Date of Service: Jared Houston, Jared L. 10/09/2021 9:00 [place] Record Number: [id number] Patient Account Number: [id number] Date of Birth/Sex: Treating RN: Feb 24, 1971 (51 y.o. [name] Primary Care Chananya Canizalez: PA [place], [place] Other Clinician: Referring Yurianna Tusing: Treating Zoye Chandra/Extender: Jadene Pierini in Treatment: 0 Primary Learner Assessed: Patient Learning Preferences/Education Level/Primary Language Learning Preference: Explanation, Demonstration, Printed Material Highest Education Level: High School Preferred Language: English Cognitive Barrier Language Barrier: No Translator Needed: No Memory Deficit: No Emotional Barrier: No Cultural/Religious Beliefs Affecting Medical Care: No Physical Barrier Impaired Vision: Yes Glasses Impaired Hearing: No Decreased Hand dexterity: No Knowledge/Comprehension Knowledge Level: High Comprehension Level: High Ability to understand written instructions: High Ability to understand verbal instructions: High Motivation Anxiety Level: Calm Cooperation: Cooperative Education Importance: Acknowledges Need Interest in Health Problems: Asks Questions Perception: Coherent Willingness to Engage in Self-Management High Activities: Readiness to Engage in Self-Management High Activities: Electronic Signature(s) Signed: 10/09/2021 4:37:43 PM By: Baruch Gouty RN, BSN Entered By: Baruch Gouty on 10/09/2021 10:00:02 -------------------------------------------------------------------------------- Fall Risk Assessment Details Patient Name: Date of Service: Jared Houston, Jared L. 10/09/2021 9:00 [place] Record Number: [id number] Patient Account Number: [id number] Date of Birth/Sex: Treating RN: [birth year] (51 y.o. [name] Primary Care Cedric Mcclaine: PA [place], [place] Other  Clinician: Referring Garhett Bernhard: Treating Mourad Cwikla/Extender: Jadene Pierini in Treatment: 0 Fall Risk Assessment Items Have you had 2 or more falls in the last 12 monthso 0 No Have you had any fall that resulted in injury in the last 12 monthso 0 No FALLS RISK SCREEN History of falling - immediate or within 3 months 0 No Secondary diagnosis (Do you have 2 or more medical diagnoseso) 0 No Ambulatory aid None/bed rest/wheelchair/nurse 0 Yes Crutches/cane/walker 0 No Furniture 0 No Intravenous therapy Access/Saline/Heparin Lock 0 No Gait/Transferring Normal/ bed rest/ wheelchair 0 Yes Weak (short steps  with or without shuffle, stooped but able to lift head while walking, may seek 0 No support from furniture) Impaired (short steps with shuffle, may have difficulty arising from chair, head down, impaired 0 No balance) Mental Status Oriented to own ability 0 Yes Electronic Signature(s) Signed: 10/09/2021 4:37:43 PM By: Baruch Gouty RN, BSN Entered By: Baruch Gouty on 10/09/2021 10:00:13 -------------------------------------------------------------------------------- Foot Assessment Details Patient Name: Date of Service: Jared Houston, Jared L. 10/09/2021 9:00 A M Medical Record Number: [id number] Patient Account Number: [id number] Date of Birth/Sex: Treating RN: 1971-02-07 (51 y.o. [name] Primary Care Connelly [place]: PA [place], [place] Other Clinician: Referring Edrian Melucci: Treating Reid Regas/Extender: Jadene Pierini in Treatment: 0 Foot Assessment Items Site Locations + = Sensation present, - = Sensation absent, C = Callus, U = Ulcer R = Redness, W = Warmth, M = Maceration, PU = Pre-ulcerative lesion F = Fissure, S = Swelling, D = Dryness Assessment Right: Left: Other Deformity: No No Prior Foot Ulcer: No No Prior Amputation: No No Charcot Joint: No No Ambulatory Status: Ambulatory Without Help Gait: Steady Electronic Signature(s) Signed: 10/09/2021 4:37:43 PM  By: Baruch Gouty RN, BSN Entered By: Baruch Gouty on 10/09/2021 10:05:35 -------------------------------------------------------------------------------- Nutrition Risk Screening Details Patient Name: Date of Service: Jared Houston, Jared L. 10/09/2021 9:00 [place] Record Number: [id number] Patient Account Number: [id number] Date of Birth/Sex: Treating RN: Jul 07, 1970 (51 y.o. [name] Primary Care Mikyla Schachter: PA [place], [place] Other Clinician: Referring Laylonie Marzec: Treating Tamberly Pomplun/Extender: Jadene Pierini in Treatment: 0 Height (in): Weight (lbs): Body Mass Index (BMI): Nutrition Risk Screening Items Score Screening NUTRITION RISK SCREEN: I have an illness or condition that made me change the kind and/or amount of food I eat 0 No I eat fewer than two meals per day 0 No I eat few fruits and vegetables, or milk products 0 No I have three or more drinks of beer, liquor or wine almost every day 2 Yes I have tooth or mouth problems that make it hard for me to eat 0 No I don't always have enough money to buy the food I need 0 No I eat alone most of the time 0 No I take three or more different prescribed or over-the-counter drugs a day 0 No Without wanting to, I have lost or gained 10 pounds in the last six months 0 No I am not always physically able to shop, cook and/or feed myself 0 No Nutrition Protocols Good Risk Protocol 0 No interventions needed Moderate Risk Protocol High Risk Proctocol Risk Level: Good Risk Score: 2 Electronic Signature(s) Signed: 10/09/2021 4:37:43 PM By: Baruch Gouty RN, BSN Entered By: Baruch Gouty on 10/09/2021 10:01:20
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
## 2021-10-16 ENCOUNTER — Encounter (HOSPITAL_BASED_OUTPATIENT_CLINIC_OR_DEPARTMENT_OTHER): Payer: Self-pay | Attending: General Surgery | Admitting: General Surgery

## 2021-10-16 DIAGNOSIS — J45998 Other asthma: Secondary | ICD-10-CM | POA: Insufficient documentation

## 2021-10-16 DIAGNOSIS — T310 Burns involving less than 10% of body surface: Secondary | ICD-10-CM | POA: Insufficient documentation

## 2021-10-16 DIAGNOSIS — X58XXXD Exposure to other specified factors, subsequent encounter: Secondary | ICD-10-CM | POA: Insufficient documentation

## 2021-10-16 DIAGNOSIS — T25222D Burn of second degree of left foot, subsequent encounter: Secondary | ICD-10-CM | POA: Insufficient documentation

## 2021-10-16 DIAGNOSIS — F17210 Nicotine dependence, cigarettes, uncomplicated: Secondary | ICD-10-CM | POA: Insufficient documentation
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
## 2021-10-23 ENCOUNTER — Encounter (HOSPITAL_BASED_OUTPATIENT_CLINIC_OR_DEPARTMENT_OTHER): Payer: Self-pay | Admitting: General Surgery
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
## 2021-11-06 ENCOUNTER — Encounter (HOSPITAL_BASED_OUTPATIENT_CLINIC_OR_DEPARTMENT_OTHER): Payer: Self-pay | Admitting: General Surgery
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
## 2021-11-20 ENCOUNTER — Ambulatory Visit (HOSPITAL_BASED_OUTPATIENT_CLINIC_OR_DEPARTMENT_OTHER): Payer: Self-pay | Admitting: General Surgery

## 2023-05-02 ENCOUNTER — Ambulatory Visit: Payer: No Typology Code available for payment source | Admitting: Physician Assistant

## 2023-05-02 VITALS — BP 136/79 | HR 74 | Ht 67.0 in | Wt 192.0 lb

## 2023-05-02 DIAGNOSIS — F17210 Nicotine dependence, cigarettes, uncomplicated: Secondary | ICD-10-CM

## 2023-05-02 DIAGNOSIS — F1910 Other psychoactive substance abuse, uncomplicated: Secondary | ICD-10-CM | POA: Diagnosis not present

## 2023-05-02 DIAGNOSIS — I10 Essential (primary) hypertension: Secondary | ICD-10-CM

## 2023-05-02 DIAGNOSIS — H04123 Dry eye syndrome of bilateral lacrimal glands: Secondary | ICD-10-CM | POA: Diagnosis not present

## 2023-05-02 MED ORDER — CLEAR EYES ADV DRY & ITCHY RLF 0.25 % OP SOLN: 1.0000 [drp] | Freq: Two times a day (BID) | OPHTHALMIC | 0 refills | Status: AC | PRN

## 2023-05-02 NOTE — Patient Instructions (Signed)
You are going to use Clear Eyes twice daily as needed.  Please let us know if there is anything else we can do for you.  Roney Jaffe, PA-C Physician Assistant [facility] [email or web address]   Dry Eye  Dry eye, also called keratoconjunctivitis sicca, is a condition caused by dryness of the membranes surrounding the eye. It happens when there are not enough healthy, natural tears in the eyes. The eyes must remain moist at all times for good comfort and vision. A small amount of tears is constantly produced by the tear glands (lacrimal glands). These glands are mainly located under the outside part of the upper eyelids. The eyelids produce oils that coat the tears to keep them from evaporating quickly. If the eyelids are inflamed (blepharitis), the lack of healthy oils can make the dry eye worse. Dry eye can happen on its own or be a symptom of several conditions, such as rheumatoid arthritis, lupus, or Sjgren's syndrome. Dry eye may be mild to severe. What are the causes? This condition may be caused by: Not making enough tears (aqueous tear-deficient dry eyes). Tears evaporating from the eyes too quickly (evaporative dry eyes). This is when there is an abnormality in the quality of your tears, especially the oils. This abnormality causes your tears to evaporate so quickly that the eyes cannot be kept moist. What increases the risk? You are more likely to develop this condition if you: Are a woman, especially if you have gone through menopause. Are older. Live in a dry climate. Live or work in a dusty or smoky area. Take certain medicines, such as: Anti-allergy medicines (antihistamines). Blood pressure medicines (antihypertensives), especially "water pills" (diuretics). Birth control pills (oral contraceptives). Laxatives. Tranquilizers. Have eyelid inflammation (blepharitis). Have a history of refractive eye surgery, such as  LASIK. Have a history of long-term contact lens use. What are the signs or symptoms? Symptoms of this condition include: Irritation. You may feel: Itchiness. Burning. A feeling as though something is stuck in the eye. Redness. Inflammation of the eyelids. Light sensitivity. Increased sensitivity and discomfort when wearing contact lenses. Vision that varies throughout the day. Occasional excessive tearing. How is this diagnosed? This condition is diagnosed based on your symptoms, your medical history, and an eye exam. Your health care provider may look at your eye using a microscope and may put dyes in your eye to check the health of the surface of your eye. You may have tests, such as a test to evaluate your tear production (Schirmer test). During this test: A small strip of special paper is gently pressed partly under your lower eyelid. Your tear production is measured by how much of the paper is moistened by your tears during a set amount of time. You may be referred to a health care provider who specializes in medical and surgical eye care (ophthalmologist). How is this treated? Treatment for this condition depends on the type and severity of the dry eyes. To help relieve your symptoms, your health care provider may recommend over-the-counter artificial tears. Artificial tears either come in bottles that have mild preservatives or in small vials or bottles without preservatives. Patients with mild dry eye may do well with tears that have preservatives, while those with more severe dry eye should just use tears without preservatives. Note that one vial may be used several times a day, but should be discarded at the end of the day. If your condition is severe, treatment may also include: Prescription eye drops.  Over-the-counter or prescription gels or ointments to moisten your eyes. A prescription nasal spray that increases tear production. Minor surgery to place plugs into the tear  drainage ducts. This keep tears from exiting the eye so that tears can stay on the surface of the eye longer. Medicines to reduce inflammation of the eyelids. Taking an omega-3 fatty acid nutritional supplement. Other treatments include making tears from your own blood (autologous serum tears), wearing special contact lenses, and even having minor surgery to partially close the outer parts of your eyelids to decrease evaporation. Follow these instructions at home: Take or apply over-the-counter and prescription medicines only as told by your health care provider. This includes eye drops. If directed, apply a warm compress to your eyes to help reduce eyelid inflammation. Place a towel over your eyes and gently press the warm compress over your eyes for about 5 minutes, or as long as told by your health care provider. Drink plenty of fluids to stay well hydrated. If possible, avoid dry, drafty environments. Wear sunglasses when outdoors to protect your eyes from the sun and wind. Use a humidifier at home to increase moisture in the air. Remember to blink often when reading or using the computer for long periods. If you wear contact lenses, remove them regularly to give your eyes a break. Always remove your contact lenses before sleeping. Have a yearly eye exam and vision test. Keep all follow-up visits. This is important. Contact a health care provider if: You have eye pain. You have pus-like fluid coming from your eye. Your symptoms get worse or do not improve with treatment. Get help right away if: Your vision suddenly changes. Summary Dry eye is dryness of the membranes surrounding the eye. Dry eye can happen on its own or be a symptom of several conditions, such as rheumatoid arthritis, lupus, or Sjgren's syndrome. This condition is diagnosed based on your symptoms, your medical history, and an eye exam. Treatment for this condition depends on the type and severity of the dry eye. To help  relieve your symptoms, your health care provider may recommend over-the-counter artificial tears. This information is not intended to replace advice given to you by your health care provider. Make sure you discuss any questions you have with your health care provider. Document Revised: 09/23/2020 Document Reviewed: 09/23/2020 Elsevier Patient Education  2024 [employer].

## 2023-05-02 NOTE — Progress Notes (Signed)
New Patient Office Visit  Subjective    Patient ID: Jared Houston, male    DOB: 12/14/1970  Age: 52 y.o. MRN: [id number]  CC:  Chief Complaint  Patient presents with   red eyes     Denies ay irritation.     HPI Jared Houston states that he is currently being treated for substance abuse at [facility] recovery center.  States that he arrived on 25 November and does plan on transitioning to an [facility] in [place].    States that he was started on amlodipine during his detox in [place].  This was prior to his arrival at [facility].  States that he is not checking his blood pressure on a daily basis.  States that he has been experiencing dry, red eyes.  States that he will use Clear Eyes in the past with relief.  No other concerns at this time     Outpatient Encounter Medications as of 05/02/2023  Medication Sig   Glycerin (CLEAR EYES ADV DRY & ITCHY RLF) 0.25 % SOLN Apply 1 drop to eye 2 (two) times daily as needed.   albuterol (PROVENTIL HFA;VENTOLIN HFA) 108 (90 BASE) MCG/ACT inhaler Inhale 2 puffs into the lungs every 4 (four) hours as needed for wheezing or shortness of breath.   amoxicillin-clavulanate (AUGMENTIN) 875-125 MG tablet Take 1 tablet by mouth every 12 (twelve) hours.   beclomethasone (QVAR) 80 MCG/ACT inhaler Inhale 1 puff into the lungs 2 (two) times daily. (Patient not taking: Reported on 12/25/2018)   Naphazoline HCl (CLEAR EYES OP) Place 1 application into both eyes daily.   naproxen sodium (ALEVE) 220 MG tablet Take 440 mg by mouth 2 (two) times daily as needed (pain).   oxyCODONE-acetaminophen (PERCOCET/ROXICET) 5-325 MG tablet Take 1 tablet by mouth every 6 (six) hours as needed for severe pain.   silver sulfADIAZINE (SILVADENE) 1 % cream Apply 1 application. topically 2 (two) times daily. With bandage changes   No facility-administered encounter medications on file as of 05/02/2023.    Past Medical History:  Diagnosis Date   Asthma      History reviewed. No pertinent surgical history.  History reviewed. No pertinent family history.  Social History   Socioeconomic History   Marital status: Single    Spouse name: Not on file   Number of children: Not on file   Years of education: Not on file   Highest education level: Not on file  Occupational History   Not on file  Tobacco Use   Smoking status: Every Day    Current packs/day: 0.50    Types: Cigarettes   Smokeless tobacco: Never  Substance and Sexual Activity   Alcohol use: Yes    Comment: 12 beers a week    Drug use: No   Sexual activity: Yes    Birth control/protection: None  Other Topics Concern   Not on file  Social History Narrative   Not on file   Social Drivers of Health   Financial Resource Strain: Not on file  Food Insecurity: Not on file  Transportation Needs: Not on file  Physical Activity: Not on file  Stress: Not on file  Social Connections: Not on file  Intimate Partner Violence: Not on file    Review of Systems  Constitutional:  Negative for chills and fever.  HENT: Negative.    Eyes:  Positive for redness. Negative for pain and discharge.  Respiratory:  Negative for shortness of breath.   Cardiovascular:  Negative for  chest pain.  Gastrointestinal: Negative.   Genitourinary: Negative.   Musculoskeletal: Negative.   Skin: Negative.   Neurological: Negative.   Endo/Heme/Allergies: Negative.   Psychiatric/Behavioral: Negative.          Objective    BP 136/79 (BP Location: Left Arm, Patient Position: Sitting, Cuff Size: Large)   Pulse 74   Ht 5\' 7"  (1.702 m)   Wt 192 lb (87.1 kg)   SpO2 97%   BMI 30.07 kg/m   Physical Exam Vitals and nursing note reviewed.  Constitutional:      Appearance: Normal appearance.  HENT:     Head: Normocephalic and atraumatic.     Right Ear: External ear normal.     Left Ear: External ear normal.     Nose: Nose normal.     Mouth/Throat:     Mouth: Mucous membranes are moist.      Pharynx: Oropharynx is clear.  Eyes:     General: Lids are normal.        Right eye: No foreign body, discharge or hordeolum.        Left eye: No foreign body, discharge or hordeolum.     Conjunctiva/sclera:     Right eye: Right conjunctiva is injected.     Left eye: Left conjunctiva is injected.  Cardiovascular:     Rate and Rhythm: Normal rate and regular rhythm.     Pulses: Normal pulses.     Heart sounds: Normal heart sounds.  Pulmonary:     Effort: Pulmonary effort is normal.     Breath sounds: Normal breath sounds.  Musculoskeletal:        General: Normal range of motion.     Cervical back: Normal range of motion and neck supple.  Skin:    General: Skin is warm and dry.  Neurological:     General: No focal deficit present.     Mental Status: He is alert.  Psychiatric:        Mood and Affect: Mood normal.        Behavior: Behavior normal.        Thought Content: Thought content normal.        Judgment: Judgment normal.          Assessment & Plan:   Problem List Items Addressed This Visit   None Visit Diagnoses       Dry eyes    -  Primary   Relevant Medications   Glycerin (CLEAR EYES ADV DRY & ITCHY RLF) 0.25 % SOLN     Essential hypertension         Substance abuse (HCC)          1. Dry eyes (Primary) Trial clear eyes.  Patient education given on supportive care.   - Glycerin (CLEAR EYES ADV DRY & ITCHY RLF) 0.25 % SOLN; Apply 1 drop to eye 2 (two) times daily as needed.  Dispense: 15 mL; Refill: 0  2. Essential hypertension Patient encouraged to check blood pressure at home, keep a written log and have available for all office visits.  Patient declined any further wellness exam treatment today, declined screening for prostate cancer, monitoring of metabolic panel.  3. Substance abuse (HCC) Currently in substance abuse treatment program   I have reviewed the patient's medical history (PMH, PSH, Social History, Family History, Medications, and  allergies) , and have been updated if relevant. I spent 20 minutes reviewing chart and  face to face time with patient.    Return  if symptoms worsen or fail to improve.   Kasandra Knudsen Mayers, PA-C

## 2023-05-03 ENCOUNTER — Encounter: Payer: Self-pay | Admitting: Physician Assistant

## 2023-05-30 ENCOUNTER — Other Ambulatory Visit: Payer: Self-pay | Admitting: Physician Assistant

## 2023-05-30 ENCOUNTER — Encounter: Payer: Self-pay | Admitting: Physician Assistant

## 2023-05-30 ENCOUNTER — Ambulatory Visit: Payer: MEDICAID | Admitting: Physician Assistant

## 2023-05-30 VITALS — BP 128/77 | HR 118 | Ht 65.0 in | Wt 192.0 lb

## 2023-05-30 DIAGNOSIS — R000 Tachycardia, unspecified: Secondary | ICD-10-CM

## 2023-05-30 DIAGNOSIS — I10 Essential (primary) hypertension: Secondary | ICD-10-CM

## 2023-05-30 DIAGNOSIS — Z125 Encounter for screening for malignant neoplasm of prostate: Secondary | ICD-10-CM

## 2023-05-30 DIAGNOSIS — E782 Mixed hyperlipidemia: Secondary | ICD-10-CM

## 2023-05-30 DIAGNOSIS — Z13220 Encounter for screening for lipoid disorders: Secondary | ICD-10-CM

## 2023-05-30 DIAGNOSIS — F17210 Nicotine dependence, cigarettes, uncomplicated: Secondary | ICD-10-CM

## 2023-05-30 DIAGNOSIS — H04123 Dry eye syndrome of bilateral lacrimal glands: Secondary | ICD-10-CM | POA: Diagnosis not present

## 2023-05-30 DIAGNOSIS — R7989 Other specified abnormal findings of blood chemistry: Secondary | ICD-10-CM

## 2023-05-30 MED ORDER — AMLODIPINE BESYLATE 5 MG PO TABS: 5.0000 mg | ORAL_TABLET | Freq: Every day | ORAL | 1 refills | Status: DC

## 2023-05-30 NOTE — Patient Instructions (Signed)
 We will call you with today's lab results.  We will call you in approximately 4 weeks to let you know where we are located for you to return to follow-up.  Jared CANDIE Sage, PA-C Physician Assistant [facility] [email or web address]   Low-Sodium Eating Plan Salt (sodium) helps you keep a healthy balance of fluids in your body. Too much sodium can raise your blood pressure. It can also cause fluid and waste to be held in your body. Your health care provider or dietitian may recommend a low-sodium eating plan if you have high blood pressure (hypertension), kidney disease, liver disease, or heart failure. Eating less sodium can help lower your blood pressure and reduce swelling. It can also protect your heart, liver, and kidneys. What are tips for following this plan? Reading food labels  Check food labels for the amount of sodium per serving. If you eat more than one serving, you must multiply the listed amount by the number of servings. Choose foods with less than 140 milligrams (mg) of sodium per serving. Avoid foods with 300 mg of sodium or more per serving. Always check how much sodium is in a product, even if the label says unsalted or no salt added. Shopping  Buy products labeled as low-sodium or no salt added. Buy fresh foods. Avoid canned foods and pre-made or frozen meals. Avoid canned, cured, or processed meats. Buy breads that have less than 80 mg of sodium per slice. Cooking  Eat more home-cooked food. Try to eat less restaurant, buffet, and fast food. Try not to add salt when you cook. Use salt-free seasonings or herbs instead of table salt or sea salt. Check with your provider or pharmacist before using salt substitutes. Cook with plant-based oils, such as canola, sunflower, or olive oil. Meal planning When eating at a restaurant, ask if your food can be made with less salt or no salt. Avoid dishes labeled as  brined, pickled, cured, or smoked. Avoid dishes made with soy sauce, miso, or teriyaki sauce. Avoid foods that have monosodium glutamate (MSG) in them. MSG may be added to some restaurant food, sauces, soups, bouillon, and canned foods. Make meals that can be grilled, baked, poached, roasted, or steamed. These are often made with less sodium. General information Try to limit your sodium intake to 1,500-2,300 mg each day, or the amount told by your provider. What foods should I eat? Fruits Fresh, frozen, or canned fruit. Fruit juice. Vegetables Fresh or frozen vegetables. No salt added canned vegetables. No salt added tomato sauce and paste. Low-sodium or reduced-sodium tomato and vegetable juice. Grains Low-sodium cereals, such as oats, puffed wheat and rice, and shredded wheat. Low-sodium crackers. Unsalted rice. Unsalted pasta. Low-sodium bread. Whole grain breads and whole grain pasta. Meats and other proteins Fresh or frozen meat, poultry, seafood, and fish. These should have no added salt. Low-sodium canned tuna and salmon. Unsalted nuts. Dried peas, beans, and lentils without added salt. Unsalted canned beans. Eggs. Unsalted nut butters. Dairy Milk. Soy milk. Cheese that is naturally low in sodium, such as ricotta cheese, fresh mozzarella, or Swiss cheese. Low-sodium or reduced-sodium cheese. Cream cheese. Yogurt. Seasonings and condiments Fresh and dried herbs and spices. Salt-free seasonings. Low-sodium mustard and ketchup. Sodium-free salad dressing. Sodium-free light mayonnaise. Fresh or refrigerated horseradish. Lemon juice. Vinegar. Other foods Homemade, reduced-sodium, or low-sodium soups. Unsalted popcorn and pretzels. Low-salt or salt-free chips. The items listed above may not be all the foods and drinks you can have. Talk to a  dietitian to learn more. What foods should I avoid? Vegetables Sauerkraut, pickled vegetables, and relishes. Olives. French fries. Onion rings.  Regular canned vegetables, except low-sodium or reduced-sodium items. Regular canned tomato sauce and paste. Regular tomato and vegetable juice. Frozen vegetables in sauces. Grains Instant hot cereals. Bread stuffing, pancake, and biscuit mixes. Croutons. Seasoned rice or pasta mixes. Noodle soup cups. Boxed or frozen macaroni and cheese. Regular salted crackers. Self-rising flour. Meats and other proteins Meat or fish that is salted, canned, smoked, spiced, or pickled. Precooked or cured meat, such as sausages or meat loaves. [place]. Ham. Pepperoni. Hot dogs. Corned beef. Chipped beef. Salt pork. Jerky. Pickled herring, anchovies, and sardines. Regular canned tuna. Salted nuts. Dairy Processed cheese and cheese spreads. Hard cheeses. Cheese curds. Blue cheese. Feta cheese. String cheese. Regular cottage cheese. Buttermilk. Canned milk. Fats and oils Salted butter. Regular margarine. Ghee. Bacon fat. Seasonings and condiments Onion salt, garlic salt, seasoned salt, table salt, and sea salt. Canned and packaged gravies. Worcestershire sauce. Tartar sauce. Barbecue sauce. Teriyaki sauce. Soy sauce, including reduced-sodium soy sauce. Steak sauce. Fish sauce. Oyster sauce. Cocktail sauce. Horseradish that you find on the shelf. Regular ketchup and mustard. Meat flavorings and tenderizers. Bouillon cubes. Hot sauce. Pre-made or packaged marinades. Pre-made or packaged taco seasonings. Relishes. Regular salad dressings. Salsa. Other foods Salted popcorn and pretzels. Corn chips and puffs. Potato and tortilla chips. Canned or dried soups. Pizza. Frozen entrees and pot pies. The items listed above may not be all the foods and drinks you should avoid. Talk to a dietitian to learn more. This information is not intended to replace advice given to you by your health care provider. Make sure you discuss any questions you have with your health care provider. Document Revised: 05/20/2022 Document Reviewed:  05/20/2022 Elsevier Patient Education  2024 [employer].

## 2023-05-30 NOTE — Progress Notes (Signed)
 Established Patient Office Visit  Subjective   Patient ID: Jared Houston, male    DOB: Jul 14, 1970  Age: 53 y.o. MRN: [id number]  Chief Complaint  Patient presents with   Medication Refill    Recently was treated at [facility], Medication was given for only a 5 day supple of amlodipine  5 mg . He has been without medication x2 days.    Discussed the use of AI scribe software for clinical note transcription with the patient, who gave verbal consent to proceed.  History of Present Illness         The patient, recently discharged from [facility], is in the process of transitioning to [facility]. They report no new health concerns since their last visit.  States that he has been out of his blood pressure medication for the past 2 days.  The patient is also seeking advice on dietary changes to manage their hypertension, specifically regarding sodium intake.      Past Medical History:  Diagnosis Date   Asthma    Social History   Socioeconomic History   Marital status: Single    Spouse name: Not on file   Number of children: Not on file   Years of education: Not on file   Highest education level: Not on file  Occupational History   Not on file  Tobacco Use   Smoking status: Every Day    Current packs/day: 0.50    Types: Cigarettes   Smokeless tobacco: Never  Substance and Sexual Activity   Alcohol use: Yes    Comment: 12 beers a week    Drug use: No   Sexual activity: Yes    Birth control/protection: None  Other Topics Concern   Not on file  Social History Narrative   Not on file   Social Drivers of Health   Financial Resource Strain: Not on file  Food Insecurity: Not on file  Transportation Needs: Not on file  Physical Activity: Not on file  Stress: Not on file  Social Connections: Not on file  Intimate Partner Violence: Not on file   History reviewed. No pertinent family history. Allergies  Allergen Reactions   Rocephin  [Ceftriaxone ] Itching     Hive, itching, oral swelling after im injection in [facility] for suspected STD treatment    Review of Systems  Constitutional: Negative.   HENT: Negative.    Eyes:  Positive for redness.  Respiratory:  Negative for shortness of breath.   Cardiovascular:  Negative for chest pain.  Gastrointestinal: Negative.   Genitourinary: Negative.   Musculoskeletal: Negative.   Skin: Negative.   Neurological: Negative.   Endo/Heme/Allergies: Negative.   Psychiatric/Behavioral: Negative.        Objective:     BP 128/77 (BP Location: Left Arm, Patient Position: Sitting, Cuff Size: Large)   Pulse (!) 118   Ht 5' 5 (1.651 m)   Wt 192 lb (87.1 kg)   BMI 31.95 kg/m  BP Readings from Last 3 Encounters:  05/30/23 128/77  05/02/23 136/79  10/09/21 (!) 151/86   Wt Readings from Last 3 Encounters:  05/30/23 192 lb (87.1 kg)  05/02/23 192 lb (87.1 kg)  12/25/18 175 lb (79.4 kg)    Physical Exam Vitals and nursing note reviewed.  Constitutional:      Appearance: Normal appearance.  HENT:     Head: Normocephalic and atraumatic.     Right Ear: External ear normal.     Left Ear: External ear normal.  Nose: Nose normal.     Mouth/Throat:     Mouth: Mucous membranes are moist.     Pharynx: Oropharynx is clear.  Eyes:     General:        Right eye: Discharge present.        Left eye: Discharge present.    Extraocular Movements: Extraocular movements intact.     Pupils: Pupils are equal, round, and reactive to light.  Cardiovascular:     Rate and Rhythm: Regular rhythm. Tachycardia present.     Pulses: Normal pulses.     Heart sounds: Normal heart sounds.  Pulmonary:     Effort: Pulmonary effort is normal.     Breath sounds: Normal breath sounds.  Musculoskeletal:        General: Normal range of motion.     Cervical back: Normal range of motion and neck supple.  Skin:    General: Skin is warm and dry.  Neurological:     General: No focal deficit present.     Mental Status: He  is oriented to person, place, and time.  Psychiatric:        Mood and Affect: Mood normal.        Behavior: Behavior normal.        Thought Content: Thought content normal.        Judgment: Judgment normal.        Assessment & Plan:   Problem List Items Addressed This Visit   None Visit Diagnoses       Essential hypertension    -  Primary   Relevant Medications   amLODipine  (NORVASC ) 5 MG tablet   Other Relevant Orders   Comp. Metabolic Panel (12)   CBC with Differential/Platelet     Tachycardia       Relevant Orders   TSH     Dry eyes         Screening, lipid       Relevant Orders   Lipid panel     Screening PSA (prostate specific antigen)       Relevant Orders   PSA      1. Essential hypertension (Primary) Continue current regimen.  Patient education given on low-sodium diet.  Patient encouraged to return to mobile unit in 4 weeks.  Patient will be more established at that time and will help patient find a primary care provider.  Fasting labs completed today.  Patient did have cup of noodles, 1 hour previous - amLODipine  (NORVASC ) 5 MG tablet; Take 1 tablet (5 mg total) by mouth daily.  Dispense: 30 tablet; Refill: 1 - Comp. Metabolic Panel (12) - CBC with Differential/Platelet  2. Tachycardia  - TSH  3. Dry eyes Continue over-the-counter drops as needed  4. Screening, lipid  - Lipid panel  5. Screening PSA (prostate specific antigen)  - PSA   I have reviewed the patient's medical history (PMH, PSH, Social History, Family History, Medications, and allergies) , and have been updated if relevant. I spent 30 minutes reviewing chart and  face to face time with patient.    Return in about 4 weeks (around 06/27/2023) for With [facility].    Kirk RAMAN Mayers, PA-C

## 2023-05-31 DIAGNOSIS — I10 Essential (primary) hypertension: Secondary | ICD-10-CM | POA: Insufficient documentation

## 2023-05-31 DIAGNOSIS — E782 Mixed hyperlipidemia: Secondary | ICD-10-CM | POA: Insufficient documentation

## 2023-05-31 LAB — CBC WITH DIFFERENTIAL/PLATELET
Basophils Absolute: 0 10*3/uL (ref 0.0–0.2)
Basos: 1 %
EOS (ABSOLUTE): 0.2 10*3/uL (ref 0.0–0.4)
Eos: 4 %
Hematocrit: 46.4 % (ref 37.5–51.0)
Hemoglobin: 15.8 g/dL (ref 13.0–17.7)
Immature Grans (Abs): 0 10*3/uL (ref 0.0–0.1)
Immature Granulocytes: 1 %
Lymphocytes Absolute: 1.5 10*3/uL (ref 0.7–3.1)
Lymphs: 35 %
MCH: 33 pg (ref 26.6–33.0)
MCHC: 34.1 g/dL (ref 31.5–35.7)
MCV: 97 fL (ref 79–97)
Monocytes Absolute: 0.5 10*3/uL (ref 0.1–0.9)
Monocytes: 11 %
Neutrophils Absolute: 2 10*3/uL (ref 1.4–7.0)
Neutrophils: 48 %
Platelets: 397 10*3/uL (ref 150–450)
RBC: 4.79 x10E6/uL (ref 4.14–5.80)
RDW: 14.4 % (ref 11.6–15.4)
WBC: 4.1 10*3/uL (ref 3.4–10.8)

## 2023-05-31 LAB — LIPID PANEL
Chol/HDL Ratio: 4.9 {ratio} (ref 0.0–5.0)
Cholesterol, Total: 219 mg/dL — ABNORMAL HIGH (ref 100–199)
HDL: 45 mg/dL (ref >39)
LDL Chol Calc (NIH): 134 mg/dL — ABNORMAL HIGH (ref 0–99)
Triglycerides: 222 mg/dL — ABNORMAL HIGH (ref 0–149)
VLDL Cholesterol Cal: 40 mg/dL (ref 5–40)

## 2023-05-31 LAB — COMP. METABOLIC PANEL (12)
AST: 25 [IU]/L (ref 0–40)
Albumin: 4.4 g/dL (ref 3.8–4.9)
Alkaline Phosphatase: 56 [IU]/L (ref 44–121)
BUN/Creatinine Ratio: 9 (ref 9–20)
BUN: 8 mg/dL (ref 6–24)
Bilirubin Total: <0.2 mg/dL (ref 0.0–1.2)
Calcium: 8.8 mg/dL (ref 8.7–10.2)
Chloride: 103 mmol/L (ref 96–106)
Creatinine, Ser: 0.91 mg/dL (ref 0.76–1.27)
Globulin, Total: 2.1 g/dL (ref 1.5–4.5)
Glucose: 89 mg/dL (ref 70–99)
Potassium: 4.3 mmol/L (ref 3.5–5.2)
Sodium: 141 mmol/L (ref 134–144)
Total Protein: 6.5 g/dL (ref 6.0–8.5)
eGFR: 100 mL/min/{1.73_m2} (ref >59)

## 2023-05-31 LAB — PSA: Prostate Specific Ag, Serum: 4 ng/mL (ref 0.0–4.0)

## 2023-05-31 LAB — TSH: TSH: 0.449 u[IU]/mL — ABNORMAL LOW (ref 0.450–4.500)

## 2023-05-31 MED ORDER — ATORVASTATIN CALCIUM 10 MG PO TABS: 10.0000 mg | ORAL_TABLET | Freq: Every day | ORAL | 2 refills | Status: DC

## 2023-05-31 NOTE — Addendum Note (Signed)
 Addended by: Roney Jaffe on: 05/31/2023 10:11 AM   Modules accepted: Orders

## 2023-06-07 ENCOUNTER — Ambulatory Visit: Payer: MEDICAID | Admitting: Physician Assistant

## 2023-06-07 VITALS — BP 152/99 | HR 82 | Ht 65.0 in | Wt 191.0 lb

## 2023-06-07 DIAGNOSIS — N528 Other male erectile dysfunction: Secondary | ICD-10-CM | POA: Diagnosis not present

## 2023-06-07 DIAGNOSIS — F17210 Nicotine dependence, cigarettes, uncomplicated: Secondary | ICD-10-CM

## 2023-06-07 MED ORDER — SILDENAFIL CITRATE 100 MG PO TABS: 50.0000 mg | ORAL_TABLET | Freq: Every day | ORAL | 0 refills | Status: DC | PRN

## 2023-06-08 ENCOUNTER — Telehealth: Payer: Self-pay

## 2023-06-08 NOTE — Telephone Encounter (Signed)
Jared Houston (Key: [id number]) PA Case ID #: [id number] Rx #: [id number] Sildenafil Citrate 100MG  tablets, Quantity 10 tablets   PA has been completed and sent to [facility] for approval.

## 2023-06-09 ENCOUNTER — Encounter: Payer: Self-pay | Admitting: Physician Assistant

## 2023-06-09 NOTE — Patient Instructions (Signed)
You are going to do a trial of Viagra.  Your blood pressure is elevated today.  I do encourage you to check your blood pressure at home, keep a written log and have available for all office visits.  Please return to the mobile unit in 2 to 3 weeks for follow-up.  We will call you with today's lab results when they are available.  Roney Jaffe, PA-C Physician Assistant [facility] [email or web address]   Erectile Dysfunction Erectile dysfunction (ED) is the inability to get or keep an erection in order to have sexual intercourse. ED is considered a symptom of an underlying disorder and is not considered a disease. ED may include: Inability to get an erection. Lack of enough hardness of the erection to allow penetration. Loss of erection before sex is finished. What are the causes? This condition may be caused by: Physical causes, such as: Artery problems. This may include heart disease, high blood pressure, atherosclerosis, and diabetes. Hormonal problems, such as low testosterone. Obesity. Nerve problems. This may include back or pelvic injuries, multiple sclerosis, Parkinson's disease, spinal cord injury, and stroke. Certain medicines, such as: Pain relievers. Antidepressants. Blood pressure medicines and water pills (diuretics). Cancer medicines. Antihistamines. Muscle relaxants. Lifestyle factors, such as: Use of drugs such as marijuana, cocaine, or opioids. Excessive use of alcohol. Smoking. Lack of physical activity or exercise. Psychological causes, such as: Anxiety or stress. Sadness or depression. Exhaustion. Fear about sexual performance. Guilt. What are the signs or symptoms? Symptoms of this condition include: Inability to get an erection. Lack of enough hardness of the erection to allow penetration. Loss of the erection before sex is finished. Sometimes having normal erections, but with frequent  unsatisfactory episodes. Low sexual satisfaction in either partner due to erection problems. A curved penis occurring with erection. The curve may cause pain, or the penis may be too curved to allow for intercourse. Never having nighttime or morning erections. How is this diagnosed? This condition is often diagnosed by: Performing a physical exam to find other diseases or specific problems with the penis. Asking you detailed questions about the problem. Doing tests, such as: Blood tests to check for diabetes mellitus or high cholesterol, or to measure hormone levels. Other tests to check for underlying health conditions. An ultrasound exam to check for scarring. A test to check blood flow to the penis. Doing a sleep study at home to measure nighttime erections. How is this treated? This condition may be treated by: Medicines, such as: Medicine taken by mouth to help you achieve an erection (oral medicine). Hormone replacement therapy to replace low testosterone levels. Medicine that is injected into the penis. Your health care provider may instruct you how to give yourself these injections at home. Medicine that is delivered with a short applicator tube. The tube is inserted into the opening at the tip of the penis, which is the opening of the urethra. A tiny pellet of medicine is put in the urethra. The pellet dissolves and enhances erectile function. This is also called MUSE (medicated urethral system for erections) therapy. Vacuum pump. This is a pump with a ring on it. The pump and ring are placed on the penis and used to create pressure that helps the penis become erect. Penile implant surgery. In this procedure, you may receive: An inflatable implant. This consists of cylinders, a pump, and a reservoir. The cylinders can be inflated with a fluid that helps to create an erection, and they can be deflated  after intercourse. A semi-rigid implant. This consists of two silicone rubber rods.  The rods provide some rigidity. They are also flexible, so the penis can both curve downward in its normal position and become straight for sexual intercourse. Blood vessel surgery to improve blood flow to the penis. During this procedure, a blood vessel from a different part of the body is placed into the penis to allow blood to flow around (bypass) damaged or blocked blood vessels. Lifestyle changes, such as exercising more, losing weight, and quitting smoking. Follow these instructions at home: Medicines  Take over-the-counter and prescription medicines only as told by your health care provider. Do not increase the dosage without first discussing it with your health care provider. If you are using self-injections, do injections as directed by your health care provider. Make sure you avoid any veins that are on the surface of the penis. After giving an injection, apply pressure to the injection site for 5 minutes. Talk to your health care provider about how to prevent headaches while taking ED medicines. These medicines may cause a sudden headache due to the increase in blood flow in your body. General instructions Exercise regularly, as directed by your health care provider. Work with your health care provider to lose weight, if needed. Do not use any products that contain nicotine or tobacco. These products include cigarettes, chewing tobacco, and vaping devices, such as e-cigarettes. If you need help quitting, ask your health care provider. Before using a vacuum pump, read the instructions that come with the pump and discuss any questions with your health care provider. Keep all follow-up visits. This is important. Contact a health care provider if: You feel nauseous. You are vomiting. You get sudden headaches while taking ED medicines. You have any concerns about your sexual health. Get help right away if: You are taking oral or injectable medicines and you have an erection that lasts  longer than 4 hours. If your health care provider is unavailable, go to the nearest emergency room for evaluation. An erection that lasts much longer than 4 hours can result in permanent damage to your penis. You have severe pain in your groin or abdomen. You develop redness or severe swelling of your penis. You have redness spreading at your groin or lower abdomen. You are unable to urinate. You experience chest pain or a rapid heartbeat (palpitations) after taking oral medicines. These symptoms may represent a serious problem that is an emergency. Do not wait to see if the symptoms will go away. Get medical help right away. Call your local emergency services (911 in the [place] Do not drive yourself to the hospital. Summary Erectile dysfunction (ED) is the inability to get or keep an erection during sexual intercourse. This condition is diagnosed based on a physical exam, your symptoms, and tests to determine the cause. Treatment varies depending on the cause and may include medicines, hormone therapy, surgery, or a vacuum pump. You may need follow-up visits to make sure that you are using your medicines or devices correctly. Get help right away if you are taking or injecting medicines and you have an erection that lasts longer than 4 hours. This information is not intended to replace advice given to you by your health care provider. Make sure you discuss any questions you have with your health care provider. Document Revised: 07/30/2020 Document Reviewed: 07/30/2020 Elsevier Patient Education  2024 [employer].

## 2023-06-09 NOTE — Progress Notes (Signed)
Established Patient Office Visit  Subjective   Patient ID: Jared Houston, male    DOB: 24-Jun-1970  Age: 53 y.o. MRN: [id number]  Chief Complaint  Patient presents with   Erectile Dysfunction   Discussed the use of AI scribe software for clinical note transcription with the patient, who gave verbal consent to proceed.  History of Present Illness         The patient, a 53 year old individual with a history of hypertension and hypercholesterolemia, presents with concerns about erectile dysfunction for the past year. He reports difficulty in achieving and maintaining an erection, which has been causing distress in his relationship. The patient has tried over-the-counter remedies with limited success. He denies any issues with completing the sexual act once an erection is achieved.  The patient has been prescribed medication for his hypertension and hypercholesterolemia but has not yet started taking the cholesterol medication. He plans to start the cholesterol medication soon, as he understands the potential link between his elevated cholesterol and his erectile dysfunction.  States that he has been taking his blood pressure medication that he was recently started on, states that he has not been checking his blood pressure at home, states that he has not taken it yet today.    Past Medical History:  Diagnosis Date   Asthma    Social History   Socioeconomic History   Marital status: Single    Spouse name: Not on file   Number of children: Not on file   Years of education: Not on file   Highest education level: Not on file  Occupational History   Not on file  Tobacco Use   Smoking status: Every Day    Current packs/day: 0.50    Types: Cigarettes   Smokeless tobacco: Never  Substance and Sexual Activity   Alcohol use: Yes    Comment: 12 beers a week    Drug use: No   Sexual activity: Yes    Birth control/protection: None  Other Topics Concern   Not on file  Social  History Narrative   Not on file   Social Drivers of Health   Financial Resource Strain: Not on file  Food Insecurity: Not on file  Transportation Needs: Not on file  Physical Activity: Not on file  Stress: Not on file  Social Connections: Not on file  Intimate Partner Violence: Not on file   History reviewed. No pertinent family history. Allergies  Allergen Reactions   Rocephin [Ceftriaxone] Itching    Hive, itching, oral swelling after im injection in [facility] for suspected STD treatment    Review of Systems  Constitutional: Negative.   HENT: Negative.    Eyes: Negative.   Respiratory:  Negative for shortness of breath and wheezing.   Cardiovascular:  Negative for chest pain.  Gastrointestinal: Negative.   Genitourinary: Negative.   Musculoskeletal: Negative.   Skin: Negative.   Neurological: Negative.   Endo/Heme/Allergies: Negative.   Psychiatric/Behavioral: Negative.        Objective:     BP (!) 152/99 (BP Location: Left Arm, Patient Position: Sitting, Cuff Size: Large)   Pulse 82   Ht 5\' 5"  (1.651 m)   Wt 191 lb (86.6 kg)   SpO2 98%   BMI 31.78 kg/m  BP Readings from Last 3 Encounters:  06/07/23 (!) 152/99  05/30/23 128/77  05/02/23 136/79   Wt Readings from Last 3 Encounters:  06/07/23 191 lb (86.6 kg)  05/30/23 192 lb (87.1 kg)  05/02/23 192 lb (  87.1 kg)    Physical Exam Vitals and nursing note reviewed.  Constitutional:      Appearance: Normal appearance.  HENT:     Head: Normocephalic and atraumatic.     Right Ear: External ear normal.     Left Ear: External ear normal.     Nose: Nose normal.     Mouth/Throat:     Mouth: Mucous membranes are moist.     Pharynx: Oropharynx is clear.  Eyes:     Extraocular Movements: Extraocular movements intact.     Conjunctiva/sclera: Conjunctivae normal.     Pupils: Pupils are equal, round, and reactive to light.  Cardiovascular:     Rate and Rhythm: Normal rate and regular rhythm.     Pulses: Normal  pulses.     Heart sounds: Normal heart sounds.  Pulmonary:     Effort: Pulmonary effort is normal.     Breath sounds: Normal breath sounds.  Musculoskeletal:        General: Normal range of motion.     Cervical back: Normal range of motion and neck supple.  Skin:    General: Skin is warm and dry.  Neurological:     General: No focal deficit present.     Mental Status: He is alert and oriented to person, place, and time.  Psychiatric:        Mood and Affect: Mood normal.        Behavior: Behavior normal.        Thought Content: Thought content normal.        Judgment: Judgment normal.        Assessment & Plan:   Problem List Items Addressed This Visit   None Visit Diagnoses       Other male erectile dysfunction    -  Primary   Relevant Medications   sildenafil (VIAGRA) 100 MG tablet   Other Relevant Orders   Testosterone      1. Other male erectile dysfunction (Primary) Trial Viagra.  Patient education given on supportive care.  Red flags given for prompt reevaluation.  Patient to return to mobile unit in 2 to 3 weeks. - sildenafil (VIAGRA) 100 MG tablet; Take 0.5-1 tablets (50-100 mg total) by mouth daily as needed for erectile dysfunction.  Dispense: 10 tablet; Refill: 0 - Testosterone    I have reviewed the patient's medical history (PMH, PSH, Social History, Family History, Medications, and allergies) , and have been updated if relevant. I spent 30 minutes reviewing chart and  face to face time with patient.    Return in about 3 weeks (around 06/28/2023) for With [facility].    Kasandra Knudsen Mayers, PA-C

## 2023-06-14 ENCOUNTER — Encounter: Payer: Self-pay | Admitting: Physician Assistant

## 2023-06-14 LAB — TESTOSTERONE: Testosterone: 610 ng/dL (ref 264–916)

## 2023-07-08 ENCOUNTER — Other Ambulatory Visit: Payer: Self-pay | Admitting: Physician Assistant

## 2023-07-08 DIAGNOSIS — I10 Essential (primary) hypertension: Secondary | ICD-10-CM

## 2023-09-15 ENCOUNTER — Other Ambulatory Visit: Payer: Self-pay | Admitting: Physician Assistant

## 2023-09-15 DIAGNOSIS — E782 Mixed hyperlipidemia: Secondary | ICD-10-CM

## 2023-09-23 ENCOUNTER — Telehealth: Payer: Self-pay

## 2023-09-23 NOTE — Telephone Encounter (Signed)
 Patient called stating he Is in need of Medication refill, last seen by [facility] in 01.2025. Patient states he has been incarcerated for several months. He was scheduled for Tuesday 5.13.2025 at 2pm to be seen by Cari, for med refill.

## 2023-09-27 ENCOUNTER — Other Ambulatory Visit: Payer: Self-pay | Admitting: Physician Assistant

## 2023-09-27 ENCOUNTER — Telehealth: Payer: Self-pay

## 2023-09-27 ENCOUNTER — Encounter: Payer: Self-pay | Admitting: Physician Assistant

## 2023-09-27 ENCOUNTER — Ambulatory Visit: Payer: MEDICAID | Admitting: Physician Assistant

## 2023-09-27 VITALS — BP 155/80 | HR 87 | Ht 66.0 in

## 2023-09-27 DIAGNOSIS — N528 Other male erectile dysfunction: Secondary | ICD-10-CM

## 2023-09-27 DIAGNOSIS — E782 Mixed hyperlipidemia: Secondary | ICD-10-CM | POA: Diagnosis not present

## 2023-09-27 DIAGNOSIS — I10 Essential (primary) hypertension: Secondary | ICD-10-CM

## 2023-09-27 DIAGNOSIS — R7989 Other specified abnormal findings of blood chemistry: Secondary | ICD-10-CM

## 2023-09-27 MED ORDER — ATORVASTATIN CALCIUM 10 MG PO TABS: 10.0000 mg | ORAL_TABLET | Freq: Every day | ORAL | 2 refills | Status: DC

## 2023-09-27 MED ORDER — SILDENAFIL CITRATE 100 MG PO TABS: 50.0000 mg | ORAL_TABLET | Freq: Every day | ORAL | 0 refills | Status: DC | PRN

## 2023-09-27 MED ORDER — AMLODIPINE BESYLATE 5 MG PO TABS: 5.0000 mg | ORAL_TABLET | Freq: Every day | ORAL | 1 refills | Status: DC

## 2023-09-27 NOTE — Progress Notes (Unsigned)
 Established Patient Office Visit  Subjective   Patient ID: Jared Houston, male    DOB: Jun 30, 1970  Age: 53 y.o. MRN: [id number]  Chief Complaint  Patient presents with   Medication Refill    Bp medication, Patient was incarcerated for several months.     Discussed the use of AI scribe software for clinical note transcription with the patient, who gave verbal consent to proceed.  History of Present Illness   Jared Houston is a 53 year old male with hypertension who presents for medication refill and follow-up.  He was incarcerated for three months and received 10 mg blood pressure medication instead of his usual 5 mg dose. He experienced no dizziness or other symptoms during this time. He has been out of medication for the past few days. Blood pressure was not regularly monitored during incarceration, but he did not experience any issues.  He would like to start Viagra , which he was supposed to begin previously but did not receive.  No new concerns at this time      Past Medical History:  Diagnosis Date   Asthma    Social History   Socioeconomic History   Marital status: Single    Spouse name: Not on file   Number of children: Not on file   Years of education: Not on file   Highest education level: Not on file  Occupational History   Not on file  Tobacco Use   Smoking status: Every Day    Current packs/day: 0.50    Types: Cigarettes   Smokeless tobacco: Never  Vaping Use   Vaping status: Never Used  Substance and Sexual Activity   Alcohol use: Yes    Comment: 12 beers a week    Drug use: No   Sexual activity: Yes    Birth control/protection: None  Other Topics Concern   Not on file  Social History Narrative   Not on file   Social Drivers of Health   Financial Resource Strain: Not on file  Food Insecurity: Not on file  Transportation Needs: Not on file  Physical Activity: Not on file  Stress: Not on file  Social Connections: Not on file   Intimate Partner Violence: Not on file   History reviewed. No pertinent family history. Allergies  Allergen Reactions   Rocephin  [Ceftriaxone ] Itching    Hive, itching, oral swelling after im injection in [facility] for suspected STD treatment    Review of Systems  Constitutional: Negative.   HENT: Negative.    Eyes: Negative.   Respiratory:  Negative for shortness of breath.   Cardiovascular:  Negative for chest pain.  Gastrointestinal: Negative.   Genitourinary: Negative.   Musculoskeletal: Negative.   Skin: Negative.   Neurological: Negative.   Endo/Heme/Allergies: Negative.   Psychiatric/Behavioral: Negative.        Objective:     BP (!) 155/80 (BP Location: Left Arm, Patient Position: Sitting, Cuff Size: Large)   Pulse 87   Ht 5\' 6"  (1.676 m)   BMI 30.83 kg/m  BP Readings from Last 3 Encounters:  09/27/23 (!) 155/80  06/07/23 (!) 152/99  05/30/23 128/77   Wt Readings from Last 3 Encounters:  06/07/23 191 lb (86.6 kg)  05/30/23 192 lb (87.1 kg)  05/02/23 192 lb (87.1 kg)    Physical Exam Vitals and nursing note reviewed.  Constitutional:      Appearance: Normal appearance.  HENT:     Head: Normocephalic and atraumatic.     Right  Ear: External ear normal.     Left Ear: External ear normal.     Nose: Nose normal.     Mouth/Throat:     Mouth: Mucous membranes are moist.     Pharynx: Oropharynx is clear.  Eyes:     Extraocular Movements: Extraocular movements intact.     Conjunctiva/sclera: Conjunctivae normal.     Pupils: Pupils are equal, round, and reactive to light.  Cardiovascular:     Rate and Rhythm: Normal rate and regular rhythm.     Pulses: Normal pulses.     Heart sounds: Normal heart sounds.  Pulmonary:     Effort: Pulmonary effort is normal.     Breath sounds: Normal breath sounds.  Musculoskeletal:        General: Normal range of motion.     Cervical back: Normal range of motion and neck supple.  Skin:    General: Skin is warm and dry.   Neurological:     General: No focal deficit present.     Mental Status: He is alert and oriented to person, place, and time.  Psychiatric:        Mood and Affect: Mood normal.        Behavior: Behavior normal.        Thought Content: Thought content normal.        Judgment: Judgment normal.        Assessment & Plan:   Problem List Items Addressed This Visit       Cardiovascular and Mediastinum   Essential hypertension - Primary   Relevant Medications   amLODipine  (NORVASC ) 5 MG tablet   atorvastatin  (LIPITOR) 10 MG tablet   sildenafil  (VIAGRA ) 100 MG tablet     Other   Mixed hyperlipidemia   Relevant Medications   amLODipine  (NORVASC ) 5 MG tablet   atorvastatin  (LIPITOR) 10 MG tablet   sildenafil  (VIAGRA ) 100 MG tablet   Other male erectile dysfunction   Relevant Medications   sildenafil  (VIAGRA ) 100 MG tablet   Other Visit Diagnoses       Elevated blood pressure reading in office with diagnosis of hypertension       Relevant Medications   amLODipine  (NORVASC ) 5 MG tablet   atorvastatin  (LIPITOR) 10 MG tablet   sildenafil  (VIAGRA ) 100 MG tablet     Abnormal thyroid blood test       Relevant Orders   Thyroid Panel With TSH      1. Essential hypertension (Primary) Resume 5 mg amlodipine , patient encouraged to check blood pressure at home, keep a written log and have available for all office visits.  Patient to return to mobile unit in 4 weeks for follow-up.  Red flags given for prompt reevaluation. - amLODipine  (NORVASC ) 5 MG tablet; Take 1 tablet (5 mg total) by mouth daily.  Dispense: 30 tablet; Refill: 1  2. Elevated blood pressure reading in office with diagnosis of hypertension   3. Mixed hyperlipidemia Continue current regimen - atorvastatin  (LIPITOR) 10 MG tablet; Take 1 tablet (10 mg total) by mouth daily.  Dispense: 30 tablet; Refill: 2  4. Other male erectile dysfunction Continue with trial - sildenafil  (VIAGRA ) 100 MG tablet; Take 0.5-1 tablets  (50-100 mg total) by mouth daily as needed for erectile dysfunction.  Dispense: 10 tablet; Refill: 0  5. Abnormal thyroid blood test  - Thyroid Panel With TSH   I have reviewed the patient's medical history (PMH, PSH, Social History, Family History, Medications, and allergies) , and have been updated  if relevant. I spent 30 minutes reviewing chart and  face to face time with patient.    Return in about 4 weeks (around 10/25/2023) for With [facility].    Etter Hermann Mayers, PA-C

## 2023-09-27 NOTE — Telephone Encounter (Signed)
 PA requested Key: [id number]  Medication: Sildenafil  Citrate   Case# [id number]   PA submitted for review.

## 2023-09-27 NOTE — Patient Instructions (Addendum)
 VISIT SUMMARY:  You came in today for a follow-up and medication refill for your high blood pressure. We discussed your recent incarceration, during which you received a higher dose of your blood pressure medication, and your interest in starting Viagra . We also talked about your thyroid function and the need for follow-up testing.  YOUR PLAN:  -HYPERTENSION: Hypertension, or high blood pressure, is a condition where the force of the blood against your artery walls is too high. We will restart your usual 5 mg dose of blood pressure medication with a 30-day supply and a refill. Please follow-up in 4 weeks to check your blood pressure control.  -THYROID FUNCTION MONITORING: Thyroid function monitoring is necessary to ensure your thyroid gland is working properly. We will order a thyroid function test and review the results in MyChart. We will communicate the results to you once they are available.  How to Take Your Blood Pressure Blood pressure is a measurement of how strongly your blood is pressing against the walls of your arteries. Arteries are blood vessels that carry blood from your heart throughout your body. Your health care provider takes your blood pressure at each office visit. You can also take your own blood pressure at home with a blood pressure monitor. You may need to take your own blood pressure to: Confirm a diagnosis of high blood pressure (hypertension). Monitor your blood pressure over time. Make sure your blood pressure medicine is working. Supplies needed: Blood pressure monitor. A chair to sit in. This should be a chair where you can sit upright with your back supported. Do not sit on a soft couch or an armchair. Table or desk. Small notebook and pencil or pen. How to prepare To get the most accurate reading, avoid the following for 30 minutes before you check your blood pressure: Drinking caffeine. Drinking alcohol. Eating. Smoking. Exercising. Five minutes before  you check your blood pressure: Use the bathroom and urinate so that you have an empty bladder. Sit quietly in a chair. Do not talk. How to take your blood pressure To check your blood pressure, follow the instructions in the manual that came with your blood pressure monitor. If you have a digital blood pressure monitor, the instructions may be as follows: Sit up straight in a chair. Place your feet on the floor. Do not cross your ankles or legs. Rest your left arm at the level of your heart on a table or desk or on the arm of a chair. Pull up your shirt sleeve. Wrap the blood pressure cuff around the upper part of your left arm, 1 inch (2.5 cm) above your elbow. It is best to wrap the cuff around bare skin. Fit the cuff snugly, but not too tightly, around your arm. You should be able to place only one finger between the cuff and your arm. Position the cord so that it rests in the bend of your elbow. Press the power button. Sit quietly while the cuff inflates and deflates. Read the digital reading on the monitor screen and write the numbers down (record them) in a notebook. Wait 2-3 minutes, then repeat the steps, starting at step 1. What does my blood pressure reading mean? A blood pressure reading consists of a higher number over a lower number. Ideally, your blood pressure should be below 120/80. The first ("top") number is called the systolic pressure. It is a measure of the pressure in your arteries as your heart beats. The second ("bottom") number is called the  diastolic pressure. It is a measure of the pressure in your arteries as the heart relaxes. Blood pressure is classified into four stages. The following are the stages for adults who do not have a short-term serious illness or a chronic condition. Systolic pressure and diastolic pressure are measured in a unit called mm Hg (millimeters of mercury).  Normal Systolic pressure: below 120. Diastolic pressure: below 80. Elevated Systolic  pressure: 120-129. Diastolic pressure: below 80. Hypertension stage 1 Systolic pressure: 130-139. Diastolic pressure: 80-89. Hypertension stage 2 Systolic pressure: 140 or above. Diastolic pressure: 90 or above. You can have elevated blood pressure or hypertension even if only the systolic or only the diastolic number in your reading is higher than normal. Follow these instructions at home: Medicines Take over-the-counter and prescription medicines only as told by your health care provider. Tell your health care provider if you are having any side effects from blood pressure medicine. General instructions Check your blood pressure as often as recommended by your health care provider. Check your blood pressure at the same time every day. Take your monitor to the next appointment with your health care provider to make sure that: You are using it correctly. It provides accurate readings. Understand what your goal blood pressure numbers are. Keep all follow-up visits. This is important. General tips Your health care provider can suggest a reliable monitor that will meet your needs. There are several types of home blood pressure monitors. Choose a monitor that has an arm cuff. Do not choose a monitor that measures your blood pressure from your wrist or finger. Choose a cuff that wraps snugly, not too tight or too loose, around your upper arm. You should be able to fit only one finger between your arm and the cuff. You can buy a blood pressure monitor at most drugstores or online. Where to find more information American Heart Association: www.heart.org Contact a health care provider if: Your blood pressure is consistently high. Your blood pressure is suddenly low. Get help right away if: Your systolic blood pressure is higher than 180. Your diastolic blood pressure is higher than 120. These symptoms may be an emergency. Get help right away. Call 911. Do not wait to see if the symptoms  will go away. Do not drive yourself to the hospital. Summary Blood pressure is a measurement of how strongly your blood is pressing against the walls of your arteries. A blood pressure reading consists of a higher number over a lower number. Ideally, your blood pressure should be below 120/80. Check your blood pressure at the same time every day. Avoid caffeine, alcohol, smoking, and exercise for 30 minutes prior to checking your blood pressure. These agents can affect the accuracy of the blood pressure reading. This information is not intended to replace advice given to you by your health care provider. Make sure you discuss any questions you have with your health care provider. Document Revised: 01/15/2021 Document Reviewed: 01/15/2021 Elsevier Patient Education  2024 [employer].

## 2023-09-28 DIAGNOSIS — N528 Other male erectile dysfunction: Secondary | ICD-10-CM | POA: Insufficient documentation

## 2023-09-29 ENCOUNTER — Ambulatory Visit: Payer: Self-pay | Admitting: Physician Assistant

## 2023-09-29 LAB — THYROID PANEL WITH TSH
Free Thyroxine Index: 2 (ref 1.2–4.9)
T3 Uptake Ratio: 26 % (ref 24–39)
T4, Total: 7.5 ug/dL (ref 4.5–12.0)
TSH: 0.557 u[IU]/mL (ref 0.450–4.500)

## 2023-10-04 ENCOUNTER — Telehealth: Payer: Self-pay

## 2023-10-04 ENCOUNTER — Other Ambulatory Visit: Payer: Self-pay | Admitting: Physician Assistant

## 2023-10-04 DIAGNOSIS — N528 Other male erectile dysfunction: Secondary | ICD-10-CM

## 2023-10-04 MED ORDER — SILDENAFIL CITRATE 100 MG PO TABS
50.0000 mg | ORAL_TABLET | Freq: Every day | ORAL | 0 refills | Status: DC | PRN
Start: 2023-10-04 — End: 2024-02-07

## 2023-10-04 NOTE — Telephone Encounter (Signed)
 Patient returned call, to discuss lab results. He is also aware that a new Rx was sent to his pharmacy with the approved dosage for insurance coverage.

## 2023-10-04 NOTE — Telephone Encounter (Signed)
 Prior Authorization submitted for patient on 05.15.2025-  Key: [id number]  RX# [id number]  PA CASE ID: [id number]  Medication: Sildenafil  Citrate 100 mg  Tablets  Medication has been denied through [employer].  The request for 10 tablets per 10 days, exceeds health plans limits of 4 tablets per 30 days.  Is it possible to resend prescription to meet plan qualifications.

## 2023-10-13 ENCOUNTER — Telehealth: Payer: Self-pay

## 2023-10-13 NOTE — Telephone Encounter (Signed)
 Prior Authorization resubmitted  Quantity Change, 4 tablets per 30 days.  Key: [id number]  Sildenafil - 100mg   tablet  Case# [id number]  Rx# [id number]

## 2023-11-28 ENCOUNTER — Telehealth: Payer: Self-pay | Admitting: General Practice

## 2023-11-28 NOTE — Telephone Encounter (Signed)
 Ontacted pt to inform him of [facility] locations to complete follow up visit. Pt stated he would come 7/16 but didn't want to schedule a time.

## 2024-02-07 ENCOUNTER — Ambulatory Visit: Admitting: Physician Assistant

## 2024-02-07 VITALS — BP 135/84 | Ht 66.0 in | Wt 191.0 lb

## 2024-02-07 DIAGNOSIS — I10 Essential (primary) hypertension: Secondary | ICD-10-CM

## 2024-02-07 DIAGNOSIS — E782 Mixed hyperlipidemia: Secondary | ICD-10-CM

## 2024-02-07 DIAGNOSIS — G5602 Carpal tunnel syndrome, left upper limb: Secondary | ICD-10-CM

## 2024-02-07 DIAGNOSIS — N528 Other male erectile dysfunction: Secondary | ICD-10-CM

## 2024-02-07 DIAGNOSIS — Z1211 Encounter for screening for malignant neoplasm of colon: Secondary | ICD-10-CM

## 2024-02-07 DIAGNOSIS — Z79899 Other long term (current) drug therapy: Secondary | ICD-10-CM

## 2024-02-07 DIAGNOSIS — F17210 Nicotine dependence, cigarettes, uncomplicated: Secondary | ICD-10-CM

## 2024-02-07 MED ORDER — ATORVASTATIN CALCIUM 10 MG PO TABS: 10.0000 mg | ORAL_TABLET | Freq: Every day | ORAL | 0 refills | Status: DC

## 2024-02-07 MED ORDER — SILDENAFIL CITRATE 100 MG PO TABS: 50.0000 mg | ORAL_TABLET | Freq: Every day | ORAL | 0 refills | Status: DC | PRN

## 2024-02-07 MED ORDER — AMLODIPINE BESYLATE 5 MG PO TABS: 5.0000 mg | ORAL_TABLET | Freq: Every day | ORAL | 0 refills | Status: DC

## 2024-02-07 NOTE — Patient Instructions (Addendum)
 VISIT SUMMARY:  Today, we addressed your high blood pressure, tingling in your left hand, and your cholesterol medication. We also discussed the importance of colorectal cancer screening.  YOUR PLAN:  -ESSENTIAL HYPERTENSION: Your blood pressure is currently not well-controlled because you have been out of your medication. We will restart your blood pressure medication at a 5 mg dose, and I have sent the prescription to [facility] on [address]. High blood pressure means that the force of the blood against your artery walls is too high, which can lead to health problems if not managed. Please schedule a follow-up appointment in three months to check your progress.  -CARPAL TUNNEL SYNDROME, LEFT HAND: The tingling in your left hand is likely due to carpal tunnel syndrome, which is a condition caused by pressure on the median nerve in your wrist. To help with this, I recommend using a wrist brace, especially at night, and taking ibuprofen to reduce inflammation.  -HYPERLIPIDEMIA: You are running low on your cholesterol medication, but there are no immediate concerns. Hyperlipidemia means you have high levels of fats in your blood, which can increase your risk of heart disease. We will recheck your cholesterol levels at your next visit.  -Pinched Nerve in the Wrist (Carpal Tunnel Syndrome): What to Know  Pinched nerve in the wrist (carpal tunnel syndrome, or CTS) is a nerve problem that causes pain, numbness, and weakness in the wrist, hand, and fingers. The carpal tunnel is a narrow space that is on the palm side of your wrist. Repeated wrist motions or certain diseases may cause swelling in the tunnel. This swelling can pinch the main nerve in the wrist (the median nerve). What are the causes? CTS may be caused by: Moving your hand and wrist over and over again while doing a task. Hurting the wrist. Arthritis. A pocket of fluid (cyst) or a growth (tumor) in the carpal tunnel. Fluid buildup when  you are pregnant. Use of tools that vibrate. In some cases, the cause of CTS is not known. What increases the risk? You're more likely to have CTS if: You have a job that makes you do these things: Move your hand firmly over and over again. Work with tools that vibrate, such as drills or sanders. You're male. You have diabetes, obesity, thyroid  problems, or kidney failure. What are the signs or symptoms? Symptoms of this condition include: A tingling feeling in your fingers. You may feel this pain in the thumb, index finger, or middle finger. Tingling or loss of feeling in your hand. Pain in your entire arm. This pain may get worse when you bend your wrist and elbow for a long time. Pain in your wrist that goes up your arm to your shoulder. Pain that goes down into your palm or fingers. Weakness in your hands. You may find it hard to grab and hold items. Your symptoms may feel worse during the night. How is this diagnosed? CTS is diagnosed with a medical history and physical exam. Tests and imaging may also be done to: Check the electrical signals sent by your nerves into the muscles. Check how well electrical signals pass through your nerves. Check possible causes of your CTS. These include X-rays, ultrasound, and MRI. How is this treated? CTS may be treated with: Lifestyle changes. You will be asked to stop or change the activity that caused your problem. Physical therapy. This may include: Exercises that stretch and strengthen the muscles and tendons in the wrist and hand. Nerve gliding or  flossing exercises. These help keep nerves moving smoothly through the tissues around them. Occupational therapy. You'll learn how to use your hand again. Medicines for pain and swelling. You may have injections in your wrist. A wrist splint or brace. Surgery. Follow these instructions at home: If you have a splint or brace: Wear the splint or brace as told. Take it off only if your  provider says you can. Check the skin around it every day. Tell your provider if you see problems. Loosen the splint or brace if your fingers tingle, are numb, or turn cold and blue. Keep the splint or brace clean and dry. If the splint or brace isn't waterproof: Do not let it get wet. Cover it when you take a bath or shower. Use a cover that doesn't let any water in. Managing pain, stiffness, and swelling  Use ice or an ice pack as told. If you have a splint or brace that you can take off, remove it only as told. Place a towel between your skin and the ice. Leave the ice on for 20 minutes, 2-3 times a day. If your skin turns red, take off the ice right away to prevent skin damage. The risk of damage is higher if you can't feel pain, heat, or cold. Move your fingers often to reduce stiffness and swelling. General instructions Take your medicines only as told. Rest your wrist and hand from activity that may cause pain. If your CTS is caused by things you do at work, talk with your employer about making changes. For example, you may need a wrist pad to use while typing. Exercise as told. Follow instructions on how to do nerve gliding or flossing exercises. These help keep nerves in moving smoothly through the tissues around them. Keep all follow-up visits. This is important. Where to find more information American Academy of Orthopedic Surgeons: orthoinfo.aaos.[employer] of Neurological Disorders and Stroke: BasicFM.no Contact a health care provider if: You have new symptoms. Your pain is not controlled with medicines. Your symptoms get worse. Get help right away if: Your hand or wrist tingles or is numb, and the symptoms become very bad. This information is not intended to replace advice given to you by your health care provider. Make sure you discuss any questions you have with your health care provider. Document Revised: 03/15/2023 Document Reviewed:  12/31/2022 Elsevier Patient Education  2024 [employer].

## 2024-02-07 NOTE — Progress Notes (Unsigned)
 Established Patient Office Visit  Subjective   Patient ID: Jared Houston, male    DOB: 1970-07-25  Age: 53 y.o. MRN: [id number]  Chief Complaint  Patient presents with   Medication Refill    Patient has been without medication for 2 months. He has been experiencing tingling in right hand.    Discussed the use of AI scribe software for clinical note transcription with the patient, who gave verbal consent to proceed.  History of Present Illness   Jared Houston is a 53 year old male with hypertension who presents with medication refill and tingling in the left hand.  He has been out of his blood pressure medication for the past couple of months. Previously on a 10 mg dose, he experienced dizziness and was switched to a 5 mg dose. He has not checked his blood pressure on the 5 mg dose.  Tingling in the left hand is intermittent and not time-specific. He is right-handed and uses his hands frequently for work. The tingling resolved when on blood pressure medication but returned after running out. Moving his hand alleviates the sensation. No similar symptoms in the right hand.  He is compliant with cholesterol medication but is about to run out of it.    Past Medical History:  Diagnosis Date   Asthma    Social History   Socioeconomic History   Marital status: Single    Spouse name: Not on file   Number of children: Not on file   Years of education: Not on file   Highest education level: Not on file  Occupational History   Not on file  Tobacco Use   Smoking status: Every Day    Current packs/day: 0.50    Types: Cigarettes   Smokeless tobacco: Never  Vaping Use   Vaping status: Never Used  Substance and Sexual Activity   Alcohol use: Yes    Comment: 12 beers a week    Drug use: No   Sexual activity: Yes    Birth control/protection: None  Other Topics Concern   Not on file  Social History Narrative   Not on file   Social Drivers of Health   Financial Resource  Strain: Not on file  Food Insecurity: Not on file  Transportation Needs: Not on file  Physical Activity: Not on file  Stress: Not on file  Social Connections: Not on file  Intimate Partner Violence: Not on file   History reviewed. No pertinent family history. Allergies  Allergen Reactions   Rocephin  [Ceftriaxone ] Itching    Hive, itching, oral swelling after im injection in [facility] for suspected STD treatment    Review of Systems  Constitutional: Negative.   HENT: Negative.    Eyes: Negative.   Respiratory:  Negative for shortness of breath.   Cardiovascular:  Negative for chest pain.  Gastrointestinal: Negative.   Genitourinary: Negative.   Musculoskeletal: Negative.   Skin: Negative.   Neurological:  Positive for tingling.  Endo/Heme/Allergies: Negative.   Psychiatric/Behavioral: Negative.        Objective:     BP 135/84 (BP Location: Left Arm, Patient Position: Sitting, Cuff Size: Large)   Ht 5' 6 (1.676 m)   Wt 191 lb (86.6 kg)   SpO2 98%   BMI 30.83 kg/m  BP Readings from Last 3 Encounters:  02/07/24 135/84  09/27/23 (!) 155/80  06/07/23 (!) 152/99   Wt Readings from Last 3 Encounters:  02/07/24 191 lb (86.6 kg)  06/07/23 191 lb (86.6  kg)  05/30/23 192 lb (87.1 kg)    Physical Exam Vitals and nursing note reviewed.  Constitutional:      Appearance: Normal appearance.  HENT:     Head: Normocephalic and atraumatic.     Right Ear: External ear normal.     Left Ear: External ear normal.     Nose: Nose normal.     Mouth/Throat:     Mouth: Mucous membranes are moist.     Pharynx: Oropharynx is clear.  Eyes:     Extraocular Movements: Extraocular movements intact.     Conjunctiva/sclera: Conjunctivae normal.     Pupils: Pupils are equal, round, and reactive to light.  Cardiovascular:     Rate and Rhythm: Normal rate and regular rhythm.     Pulses: Normal pulses.     Heart sounds: Normal heart sounds.  Pulmonary:     Effort: Pulmonary effort is  normal.     Breath sounds: Normal breath sounds.  Musculoskeletal:        General: Normal range of motion.     Right wrist: Normal.     Left wrist: Normal.     Right hand: Normal. No swelling or tenderness. Normal range of motion. Normal strength.     Left hand: Normal. No swelling or tenderness. Normal range of motion. Normal strength.     Cervical back: Normal range of motion and neck supple.  Skin:    General: Skin is warm and dry.  Neurological:     General: No focal deficit present.     Mental Status: He is alert and oriented to person, place, and time.  Psychiatric:        Mood and Affect: Mood normal.        Behavior: Behavior normal.        Thought Content: Thought content normal.        Judgment: Judgment normal.        Assessment & Plan:   Problem List Items Addressed This Visit       Cardiovascular and Mediastinum   Essential hypertension   Relevant Medications   amLODipine  (NORVASC ) 5 MG tablet   atorvastatin  (LIPITOR) 10 MG tablet   sildenafil  (VIAGRA ) 100 MG tablet     Other   Mixed hyperlipidemia   Relevant Medications   amLODipine  (NORVASC ) 5 MG tablet   atorvastatin  (LIPITOR) 10 MG tablet   sildenafil  (VIAGRA ) 100 MG tablet   Other male erectile dysfunction   Relevant Medications   sildenafil  (VIAGRA ) 100 MG tablet   Other Visit Diagnoses       Colon cancer screening    -  Primary   Relevant Orders   Ambulatory referral to Gastroenterology     Carpal tunnel syndrome of left wrist           Assessment and Plan Essential hypertension Hypertension uncontrolled due to medication lapse. Current BP 135/84 mmHg.  - Restart blood pressure medication at 5 mg. Patient education given on supportive care, encouraged patient to check blood pressure at home, keep a written log and have available for all office visits.  Red flags given for prompt reevaluation  Carpal tunnel syndrome, left hand Intermittent tingling likely due to carpal tunnel syndrome.  Symptoms improve with movement. -Patient education given on supportive care  Hyperlipidemia Running low on cholesterol medication. No immediate concerns. - Recheck cholesterol levels at next visit.  General Health Maintenance - Initiate referral to gastroenterology for colonoscopy screening.   I have reviewed the patient's medical  history (PMH, PSH, Social History, Family History, Medications, and allergies) , and have been updated if relevant. I spent 30 minutes reviewing chart and  face to face time with patient.    Return in about 3 months (around 05/08/2024) for With MMU.    Kirk RAMAN Mayers, PA-C

## 2024-02-09 ENCOUNTER — Encounter: Payer: Self-pay | Admitting: Physician Assistant

## 2024-03-08 ENCOUNTER — Other Ambulatory Visit: Payer: Self-pay | Admitting: Physician Assistant

## 2024-03-08 ENCOUNTER — Telehealth: Payer: Self-pay

## 2024-03-08 DIAGNOSIS — N528 Other male erectile dysfunction: Secondary | ICD-10-CM

## 2024-03-08 MED ORDER — SILDENAFIL CITRATE 100 MG PO TABS: 50.0000 mg | ORAL_TABLET | Freq: Every day | ORAL | 0 refills | Status: DC | PRN

## 2024-03-08 NOTE — Telephone Encounter (Signed)
 Patient called in asking for refill on Viagra , would like to have it before the weekend

## 2024-03-15 NOTE — Telephone Encounter (Signed)
 Patient picked medication up on 10/23

## 2024-04-28 ENCOUNTER — Encounter: Payer: Self-pay | Admitting: Pediatrics

## 2024-05-21 ENCOUNTER — Telehealth: Payer: Self-pay | Admitting: Physician Assistant

## 2024-05-21 NOTE — Telephone Encounter (Signed)
 Follow up appt scheduled with [facility] 05/22/24

## 2024-05-22 ENCOUNTER — Encounter: Payer: Self-pay | Admitting: Physician Assistant

## 2024-05-22 ENCOUNTER — Ambulatory Visit: Payer: MEDICAID | Admitting: Physician Assistant

## 2024-05-22 ENCOUNTER — Telehealth: Payer: Self-pay

## 2024-05-22 ENCOUNTER — Ambulatory Visit: Payer: MEDICAID

## 2024-05-22 VITALS — BP 144/81 | HR 83 | Ht 67.0 in | Wt 179.0 lb

## 2024-05-22 DIAGNOSIS — Z2821 Immunization not carried out because of patient refusal: Secondary | ICD-10-CM

## 2024-05-22 DIAGNOSIS — I10 Essential (primary) hypertension: Secondary | ICD-10-CM

## 2024-05-22 DIAGNOSIS — N528 Other male erectile dysfunction: Secondary | ICD-10-CM | POA: Diagnosis not present

## 2024-05-22 DIAGNOSIS — G5602 Carpal tunnel syndrome, left upper limb: Secondary | ICD-10-CM | POA: Diagnosis not present

## 2024-05-22 DIAGNOSIS — E782 Mixed hyperlipidemia: Secondary | ICD-10-CM

## 2024-05-22 DIAGNOSIS — Z125 Encounter for screening for malignant neoplasm of prostate: Secondary | ICD-10-CM

## 2024-05-22 MED ORDER — ATORVASTATIN CALCIUM 10 MG PO TABS: 10.0000 mg | ORAL_TABLET | Freq: Every day | ORAL | 1 refills | Status: AC

## 2024-05-22 MED ORDER — AMLODIPINE BESYLATE 5 MG PO TABS: 5.0000 mg | ORAL_TABLET | Freq: Every day | ORAL | 1 refills | Status: AC

## 2024-05-22 MED ORDER — SILDENAFIL CITRATE 100 MG PO TABS: 50.0000 mg | ORAL_TABLET | Freq: Every day | ORAL | 0 refills | Status: AC | PRN

## 2024-05-22 NOTE — Progress Notes (Signed)
 "  Established Patient Office Visit  Subjective   Patient ID: Jared Houston, male    DOB: 08/24/1970  Age: 54 y.o. MRN: [id number]  Chief Complaint  Patient presents with   Hypertension    Follow up on chronic health conditions and medication management   Discussed the use of AI scribe software for clinical note transcription with the patient, who gave verbal consent to proceed.  History of Present Illness   Jared Houston is a 54 year old male with hypertension and hyperlipidemia who presents for medication refill and evaluation of hand tingling.  He ran out of his blood pressure medication two days ago and has not checked his blood pressure at home. He continues to take his cholesterol medication.  He has tingling in his left hand that began this morning and has been occurring every other day to daily. 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 He describes a pinched-nerve sensation in his neck that he associates with the hand tingling. He is not using a brace.  He states that he does get his blood pressure checked at when he donates plasma every week and is always told that it is good.  Unsure what the numbers are.    Past Medical History:  Diagnosis Date   Asthma    Social History   Socioeconomic History   Marital status: Single    Spouse name: Not on file   Number of children: Not on file   Years of education: Not on file   Highest education level: Not on file  Occupational History   Not on file  Tobacco Use   Smoking status: Every Day    Current packs/day: 0.50    Types: Cigarettes   Smokeless tobacco: Never  Vaping Use   Vaping status: Never Used  Substance and Sexual Activity   Alcohol use: Yes    Comment: 12 beers a week    Drug use: No   Sexual activity: Yes    Birth control/protection: None  Other Topics Concern   Not on file  Social History Narrative   Not on  file   Social Drivers of Health   Tobacco Use: High Risk (05/22/2024)   Patient History    Smoking Tobacco Use: Every Day    Smokeless Tobacco Use: Never    Passive Exposure: Not on file  Financial Resource Strain: Not on file  Food Insecurity: Not on file  Transportation Needs: Not on file  Physical Activity: Not on file  Stress: Not on file  Social Connections: Not on file  Intimate Partner Violence: Not on file  Depression (PHQ2-9): Low Risk (02/07/2024)   Depression (PHQ2-9)    PHQ-2 Score: 2  Alcohol Screen: Not on file  Housing: Not on file  Utilities: Not on file  Health Literacy: Not on file   History reviewed. No pertinent family history. Allergies[1]  Review of Systems  Constitutional: Negative.   HENT: Negative.    Eyes: Negative.   Respiratory:  Negative for shortness of breath.   Cardiovascular:  Negative for chest pain.  Gastrointestinal: Negative.   Genitourinary: Negative.   Musculoskeletal: Negative.   Skin: Negative.   Neurological: Negative.   Endo/Heme/Allergies: Negative.   Psychiatric/Behavioral: Negative.        Objective:     BP (!) 144/81 (BP Location: Left Arm, Patient Position: Sitting)   Pulse 83  Ht 5' 7 (1.702 m)   Wt 179 lb (81.2 kg)   SpO2 99%   BMI 28.04 kg/m  BP Readings from Last 3 Encounters:  05/22/24 (!) 144/81  02/07/24 135/84  09/27/23 (!) 155/80   Wt Readings from Last 3 Encounters:  05/22/24 179 lb (81.2 kg)  02/07/24 191 lb (86.6 kg)  06/07/23 191 lb (86.6 kg)    Physical Exam Vitals and nursing note reviewed.    GENERAL: Alert, cooperative, well developed, no acute distress. HEENT: Normocephalic, normal oropharynx, moist mucous membranes. CHEST: Clear to auscultation bilaterally, no wheezes, rhonchi, or crackles. CARDIOVASCULAR: Regular rate and rhythm, S1 and S2 normal without murmurs, rubs, or gallops. EXTREMITIES: No cyanosis or edema. NEUROLOGICAL: Cranial nerves grossly intact, moves all  extremities without gross motor or sensory deficit.    Assessment & Plan:   Problem List Items Addressed This Visit       Cardiovascular and Mediastinum   Essential hypertension - Primary   Relevant Medications   amLODipine  (NORVASC ) 5 MG tablet   atorvastatin  (LIPITOR) 10 MG tablet   sildenafil  (VIAGRA ) 100 MG tablet   Other Relevant Orders   CBC with Differential/Platelet   Comp. Metabolic Panel (12)     Other   Mixed hyperlipidemia   Relevant Medications   amLODipine  (NORVASC ) 5 MG tablet   atorvastatin  (LIPITOR) 10 MG tablet   sildenafil  (VIAGRA ) 100 MG tablet   Other Relevant Orders   Lipid panel   Other male erectile dysfunction   Relevant Medications   sildenafil  (VIAGRA ) 100 MG tablet   Other Visit Diagnoses       Carpal tunnel syndrome of left wrist         Elevated blood pressure reading in office with diagnosis of hypertension       Relevant Medications   amLODipine  (NORVASC ) 5 MG tablet   atorvastatin  (LIPITOR) 10 MG tablet   sildenafil  (VIAGRA ) 100 MG tablet     Flu vaccine refused         Screening PSA (prostate specific antigen)       Relevant Orders   PSA       Assessment and Plan Essential hypertension Blood pressure elevated at 144/81 mmHg due to missed medication.   - Refilled blood pressure medication with a 90-day supply. - Monitor blood pressure at plasma donation center and report if it exceeds 130/80 mmHg. The patient was given clear instructions to go to ER or return to medical center if symptoms don't improve, worsen or new problems develop. The patient verbalized understanding.     Mixed hyperlipidemia Currently on cholesterol medication. Plan to assess cholesterol levels for Lipitor dosing. - Ordered blood test to check cholesterol levels.  Carpal tunnel syndrome Intermittent tingling in left hand due to carpal tunnel syndrome, exacerbated by overuse. - Recommended wearing a wrist brace during sleep. Patient education given on  supportive care  Other male erectile dysfunction - Refilled Viagra  prescription   General health maintenance Due for yearly labs. Declined flu shot.  Has appointment scheduled for colonoscopy, however having difficulty with transportation issues.  Declined Cologuard-    I have reviewed the patient's medical history (PMH, PSH, Social History, Family History, Medications, and allergies) , and have been updated if relevant. I spent 30 minutes reviewing chart and  face to face time with patient.      Return in about 6 months (around 11/19/2024) for With MMU.    Scotland Korver S Mayers, PA-C     [1]  Allergies  Allergen Reactions   Rocephin  [Ceftriaxone ] Itching    Hive, itching, oral swelling after im injection in [facility] for suspected STD treatment   "

## 2024-05-22 NOTE — Patient Instructions (Addendum)
 VISIT SUMMARY:  Today, you came in for a medication refill and to discuss the tingling in your hand. We reviewed your blood pressure and cholesterol management, addressed your hand symptoms, and discussed your difficulty affording certain medications. We also went over your general health maintenance needs.  YOUR PLAN:  -ESSENTIAL HYPERTENSION: Essential hypertension means high blood pressure without a known secondary cause. Your blood pressure was elevated today because you missed your medication.  Please monitor your blood pressure at the plasma donation center and let us  know if it goes above 130/80 mmHg.   -MIXED HYPERLIPIDEMIA: Mixed hyperlipidemia means having high levels of different types of fats in your blood. You are currently taking medication for this. We ordered a blood test to check your cholesterol levels and adjust your Lipitor dose if needed.  -CARPAL TUNNEL SYNDROME: Carpal tunnel syndrome is a condition that causes tingling and numbness in your hand due to pressure on a nerve in your wrist. We recommend wearing a wrist brace while you sleep, and stretching your hands and shoulders.   -GENERAL HEALTH MAINTENANCE: We discussed your general health maintenance needs. You are due for your yearly labs, which include tests for cholesterol, prostate cancer screening, kidney and liver function, and anemia. You declined the flu shot. Your pre-op visit for your colonoscopy is scheduled for February 3rd.  INSTRUCTIONS:  Please monitor your blood pressure at the plasma donation center and report if it exceeds 130/80 mmHg. We will schedule a follow-up appointment to check your blood pressure after you resume your medication. Additionally, please complete the ordered blood tests and attend your pre-op visit for the colonoscopy on February 3rd.

## 2024-05-22 NOTE — Progress Notes (Unsigned)
 RN confirmed patient name, date of birth, and address RN confirmed date and time of procedure RN reviewed and confirmed allergies  RN reviewed and updated current medications; confirmed preferred pharmacy Pt is not on diet pills nor GLP-1 medications Pt is not on blood thinners RN reviewed medical & surgical hx  Pt denies issues with constipation  Diabetic - ** No A fib or A flutter No cardiac tests are pending  Pt is not on home 02  No issues known with past sedation with any surgeries or procedures Patient denies ever being told they had issues or difficulty with intubation  Patient unaware of any fh of malignant hyperthermia Ambulates ** RN reviewed prep instructions and explained time frames for holding certain medications RN answered patient questions; patient stated understanding Prep instructions sent

## 2024-05-22 NOTE — Telephone Encounter (Signed)
 RN twice attempted to call patient for his pre-visit appointment but phone rang multiple times with no opportunity to leave a vm.  RN mailed letter to patient notifying him that his procedure has been cancelled d/t missing his pre-visit appt.  Appointment has been cancelled.

## 2024-05-23 ENCOUNTER — Ambulatory Visit: Payer: Self-pay | Admitting: Physician Assistant

## 2024-05-23 DIAGNOSIS — R972 Elevated prostate specific antigen [PSA]: Secondary | ICD-10-CM

## 2024-05-23 LAB — CBC WITH DIFFERENTIAL/PLATELET
Basophils Absolute: 0 x10E3/uL (ref 0.0–0.2)
Basos: 1 %
EOS (ABSOLUTE): 0.2 x10E3/uL (ref 0.0–0.4)
Eos: 3 %
Hematocrit: 49.7 % (ref 37.5–51.0)
Hemoglobin: 16.7 g/dL (ref 13.0–17.7)
Immature Grans (Abs): 0 x10E3/uL (ref 0.0–0.1)
Immature Granulocytes: 0 %
Lymphocytes Absolute: 1.4 x10E3/uL (ref 0.7–3.1)
Lymphs: 24 %
MCH: 32.4 pg (ref 26.6–33.0)
MCHC: 33.6 g/dL (ref 31.5–35.7)
MCV: 97 fL (ref 79–97)
Monocytes Absolute: 0.5 x10E3/uL (ref 0.1–0.9)
Monocytes: 9 %
Neutrophils Absolute: 3.5 x10E3/uL (ref 1.4–7.0)
Neutrophils: 63 %
Platelets: 352 x10E3/uL (ref 150–450)
RBC: 5.15 x10E6/uL (ref 4.14–5.80)
RDW: 15.4 % (ref 11.6–15.4)
WBC: 5.6 x10E3/uL (ref 3.4–10.8)

## 2024-05-23 LAB — COMP. METABOLIC PANEL (12)
AST: 20 IU/L (ref 0–40)
Albumin: 4.1 g/dL (ref 3.8–4.9)
Alkaline Phosphatase: 75 IU/L (ref 47–123)
BUN/Creatinine Ratio: 8 — ABNORMAL LOW (ref 9–20)
BUN: 8 mg/dL (ref 6–24)
Bilirubin Total: 0.3 mg/dL (ref 0.0–1.2)
Calcium: 9.2 mg/dL (ref 8.7–10.2)
Chloride: 100 mmol/L (ref 96–106)
Creatinine, Ser: 1.06 mg/dL (ref 0.76–1.27)
Globulin, Total: 2.2 g/dL (ref 1.5–4.5)
Glucose: 83 mg/dL (ref 70–99)
Potassium: 4.7 mmol/L (ref 3.5–5.2)
Sodium: 142 mmol/L (ref 134–144)
Total Protein: 6.3 g/dL (ref 6.0–8.5)
eGFR: 83 mL/min/1.73

## 2024-05-23 LAB — LIPID PANEL
Chol/HDL Ratio: 2.9 ratio (ref 0.0–5.0)
Cholesterol, Total: 178 mg/dL (ref 100–199)
HDL: 61 mg/dL
LDL Chol Calc (NIH): 95 mg/dL (ref 0–99)
Triglycerides: 125 mg/dL (ref 0–149)
VLDL Cholesterol Cal: 22 mg/dL (ref 5–40)

## 2024-05-23 LAB — PSA: Prostate Specific Ag, Serum: 6.3 ng/mL — ABNORMAL HIGH (ref 0.0–4.0)

## 2024-05-28 NOTE — Progress Notes (Signed)
 Name and DOB verified. Results and providers recommendations discussed with patient. Patient stated understanding of results/recommendations and appreciation of call.

## 2024-06-06 ENCOUNTER — Telehealth: Payer: Self-pay

## 2024-06-06 NOTE — Telephone Encounter (Signed)
 Letter sent through mail regarding cancellation of colonoscopy.

## 2024-06-19 ENCOUNTER — Encounter: Admitting: Pediatrics

## 2024-06-26 ENCOUNTER — Ambulatory Visit: Payer: MEDICAID | Admitting: Urology
